# Patient Record
Sex: Female | Born: 1965 | Race: Black or African American | Hispanic: No | Marital: Single | Smoking: Former smoker
Health system: Southern US, Community
[De-identification: ages and names within clinical notes are randomized; demographics above are authoritative.]

## PROBLEM LIST (undated history)

## (undated) DIAGNOSIS — N920 Excessive and frequent menstruation with regular cycle: Secondary | ICD-10-CM

## (undated) DIAGNOSIS — Z9289 Personal history of other medical treatment: Secondary | ICD-10-CM

## (undated) DIAGNOSIS — D509 Iron deficiency anemia, unspecified: Secondary | ICD-10-CM

## (undated) DIAGNOSIS — D259 Leiomyoma of uterus, unspecified: Secondary | ICD-10-CM

## (undated) HISTORY — DX: Personal history of other medical treatment: Z92.89

## (undated) HISTORY — PX: TUBAL LIGATION: SHX77

---

## 2001-08-15 ENCOUNTER — Encounter: Payer: Self-pay | Admitting: Internal Medicine

## 2001-08-15 ENCOUNTER — Ambulatory Visit (HOSPITAL_COMMUNITY): Admission: RE | Admit: 2001-08-15 | Discharge: 2001-08-15 | Payer: Self-pay | Admitting: Internal Medicine

## 2001-09-29 ENCOUNTER — Encounter: Payer: Self-pay | Admitting: Internal Medicine

## 2001-09-29 ENCOUNTER — Emergency Department (HOSPITAL_COMMUNITY): Admission: EM | Admit: 2001-09-29 | Discharge: 2001-09-29 | Payer: Self-pay | Admitting: Internal Medicine

## 2002-02-02 ENCOUNTER — Emergency Department (HOSPITAL_COMMUNITY): Admission: EM | Admit: 2002-02-02 | Discharge: 2002-02-02 | Payer: Self-pay | Admitting: Emergency Medicine

## 2002-03-11 ENCOUNTER — Emergency Department (HOSPITAL_COMMUNITY): Admission: EM | Admit: 2002-03-11 | Discharge: 2002-03-11 | Payer: Self-pay | Admitting: Emergency Medicine

## 2002-12-14 ENCOUNTER — Emergency Department (HOSPITAL_COMMUNITY): Admission: EM | Admit: 2002-12-14 | Discharge: 2002-12-14 | Payer: Self-pay | Admitting: Emergency Medicine

## 2005-02-14 ENCOUNTER — Ambulatory Visit (HOSPITAL_COMMUNITY): Admission: RE | Admit: 2005-02-14 | Discharge: 2005-02-14 | Payer: Self-pay | Admitting: Obstetrics and Gynecology

## 2005-05-17 ENCOUNTER — Encounter: Admission: RE | Admit: 2005-05-17 | Discharge: 2005-05-17 | Payer: Self-pay | Admitting: Family Medicine

## 2005-08-28 ENCOUNTER — Emergency Department (HOSPITAL_COMMUNITY): Admission: EM | Admit: 2005-08-28 | Discharge: 2005-08-28 | Payer: Self-pay | Admitting: Emergency Medicine

## 2006-02-01 ENCOUNTER — Emergency Department (HOSPITAL_COMMUNITY): Admission: EM | Admit: 2006-02-01 | Discharge: 2006-02-01 | Payer: Self-pay | Admitting: Emergency Medicine

## 2008-04-15 ENCOUNTER — Ambulatory Visit (HOSPITAL_COMMUNITY): Admission: RE | Admit: 2008-04-15 | Discharge: 2008-04-15 | Payer: Self-pay | Admitting: Family Medicine

## 2009-01-31 ENCOUNTER — Emergency Department (HOSPITAL_COMMUNITY): Admission: EM | Admit: 2009-01-31 | Discharge: 2009-01-31 | Payer: Self-pay | Admitting: Emergency Medicine

## 2009-05-24 ENCOUNTER — Emergency Department (HOSPITAL_COMMUNITY): Admission: EM | Admit: 2009-05-24 | Discharge: 2009-05-24 | Payer: Self-pay | Admitting: Emergency Medicine

## 2010-02-27 ENCOUNTER — Encounter: Payer: Self-pay | Admitting: Family Medicine

## 2010-02-27 ENCOUNTER — Encounter: Payer: Self-pay | Admitting: Obstetrics & Gynecology

## 2010-04-26 LAB — URINE MICROSCOPIC-ADD ON

## 2010-04-26 LAB — URINALYSIS, ROUTINE W REFLEX MICROSCOPIC
Bilirubin Urine: NEGATIVE
Hgb urine dipstick: NEGATIVE
Nitrite: NEGATIVE
Protein, ur: NEGATIVE mg/dL
pH: 6 (ref 5.0–8.0)

## 2010-07-25 ENCOUNTER — Emergency Department (HOSPITAL_COMMUNITY)
Admission: EM | Admit: 2010-07-25 | Discharge: 2010-07-25 | Disposition: A | Discharge status: Home / Self Care | Payer: Self-pay | Attending: Emergency Medicine | Admitting: Emergency Medicine

## 2010-07-25 ENCOUNTER — Emergency Department (HOSPITAL_COMMUNITY): Payer: Self-pay

## 2010-07-25 DIAGNOSIS — W19XXXA Unspecified fall, initial encounter: Secondary | ICD-10-CM | POA: Insufficient documentation

## 2010-07-25 DIAGNOSIS — Z79899 Other long term (current) drug therapy: Secondary | ICD-10-CM | POA: Insufficient documentation

## 2010-07-25 DIAGNOSIS — IMO0002 Reserved for concepts with insufficient information to code with codable children: Secondary | ICD-10-CM | POA: Insufficient documentation

## 2013-04-14 ENCOUNTER — Emergency Department (HOSPITAL_COMMUNITY)
Admission: EM | Admit: 2013-04-14 | Discharge: 2013-04-15 | Disposition: A | Discharge status: Home / Self Care | Payer: Managed Care, Other (non HMO) | Attending: Emergency Medicine | Admitting: Emergency Medicine

## 2013-04-14 ENCOUNTER — Encounter (HOSPITAL_COMMUNITY): Payer: Self-pay | Admitting: Emergency Medicine

## 2013-04-14 DIAGNOSIS — Z87891 Personal history of nicotine dependence: Secondary | ICD-10-CM | POA: Insufficient documentation

## 2013-04-14 DIAGNOSIS — R112 Nausea with vomiting, unspecified: Secondary | ICD-10-CM | POA: Insufficient documentation

## 2013-04-14 DIAGNOSIS — R000 Tachycardia, unspecified: Secondary | ICD-10-CM | POA: Insufficient documentation

## 2013-04-14 DIAGNOSIS — R197 Diarrhea, unspecified: Secondary | ICD-10-CM | POA: Insufficient documentation

## 2013-04-14 MED ORDER — ONDANSETRON HCL 4 MG PO TABS: 4.0000 mg | ORAL_TABLET | Freq: Four times a day (QID) | ORAL | Status: DC

## 2013-04-14 MED ORDER — ONDANSETRON HCL 4 MG/2ML IJ SOLN
4.0000 mg | Freq: Once | INTRAMUSCULAR | Status: AC
  Administered 2013-04-14: 4 mg via INTRAVENOUS
  Filled 2013-04-14: qty 2

## 2013-04-14 MED ORDER — SODIUM CHLORIDE 0.9 % IV BOLUS (SEPSIS)
1000.0000 mL | Freq: Once | INTRAVENOUS | Status: AC
  Administered 2013-04-14: 1000 mL via INTRAVENOUS

## 2013-04-14 MED ORDER — KETOROLAC TROMETHAMINE 30 MG/ML IJ SOLN
30.0000 mg | Freq: Once | INTRAMUSCULAR | Status: AC
  Administered 2013-04-14: 30 mg via INTRAVENOUS
  Filled 2013-04-14: qty 1

## 2013-04-14 NOTE — ED Notes (Signed)
Pt reports flu like symptoms that started around 1000 this AM. Pt reports vomiting, diarrhea & having a fever. Pt took tylenol around 1500.

## 2013-04-14 NOTE — ED Provider Notes (Signed)
CSN: [id number]     Arrival date & time 04/14/13  2214 History   This chart was scribed for Shelley Lower, MD by Era Bumpers, ED scribe. This patient was seen in room APA03/[facility]03 and the patient's care was started at 2214.  Chief Complaint  Patient presents with  . Influenza   The history is provided by the patient. No language interpreter was used.   HPI Comments: Shelley Doyle is a 48 y.o. female who presents to the Emergency Department for nausea, emesis and diarrhea that began this AM about 11 hours ago. She tried some OTC ant emetic medicine w/no relief. She also tried Tylenol for fever of 100.8 F w/no relief. 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 She reports associated chills. She denies any blood in vomitus or stool. Denies recent out of country travel. She denies medical hx. She denies abdominal or back pain.   History reviewed. No pertinent past medical history. History reviewed. No pertinent past surgical history. No family history on file. History  Substance Use Topics  . Smoking status: Former Research scientist (life sciences)  . Smokeless tobacco: Not on file  . Alcohol Use: No   OB History   Grav Para Term Preterm Abortions TAB SAB Ect Mult Living                 Review of Systems  Constitutional: Positive for fever and chills.  Respiratory: Negative for cough and shortness of breath.   Cardiovascular: Negative for chest pain.  Gastrointestinal: Positive for nausea, vomiting and diarrhea. Negative for abdominal pain.  Musculoskeletal: Negative for back pain.  All other systems reviewed and are negative.    Allergies  Review of patient's allergies indicates no known allergies.  Home Medications   Current Outpatient Rx  Name  Route  Sig  Dispense  Refill  . anti-nausea (EMETROL) solution   Oral   Take 10 mLs by mouth 2 (two) times daily as needed for nausea or vomiting (medication was only taken twice).          Triage Vitals: BP 117/77  Pulse 120   Temp(Src) 99.3 F (37.4 C) (Oral)  Resp 18  Ht 5' 9.75" (1.772 m)  Wt 174 lb 2 oz (78.983 kg)  BMI 25.15 kg/m2  SpO2 97%  LMP 04/04/2013  Physical Exam  Nursing note and vitals reviewed. Constitutional: She is oriented to person, place, and time. She appears well-developed and well-nourished. No distress.  HENT:  Head: Normocephalic and atraumatic.  Mildly dry mucous membranes  Eyes: Conjunctivae are normal. Right eye exhibits no discharge. Left eye exhibits no discharge.  Neck: Normal range of motion.  Cardiovascular: Regular rhythm and normal heart sounds.   tachycardic  Pulmonary/Chest: Effort normal and breath sounds normal. No respiratory distress. She has no wheezes. She has no rales.  Abdominal: Soft. She exhibits no distension. There is no tenderness.  Musculoskeletal: Normal range of motion. She exhibits no edema.  Neurological: She is alert and oriented to person, place, and time.  Skin: Skin is warm and dry. No rash noted.  Psychiatric: She has a normal mood and affect. Thought content normal.    ED Course  Procedures (including critical care time) DIAGNOSTIC STUDIES: Oxygen Saturation is 97% on room air, normal by my interpretation.    COORDINATION OF CARE: At 1110 PM Discussed treatment plan with patient which includes IV fluids, nausea medicine and toradol. Patient agrees.   Labs Review Labs Reviewed - No data to  display Imaging Review No results found.  IV fluids provided IV Zofran. IV Toradol.  On recheck patient is feeling much better, she is tolerating by mouth fluids without any further nausea or vomiting. Her repeat abdominal exam is soft, nontender and nondistended. She is requesting to be discharged home. Work note provided. Prescription for Zofran provided as needed. Patient will take Imodium for any persistent diarrhea. She agrees to return precautions and outpatient followup.  MDM   Final diagnoses:  Nausea vomiting and diarrhea   Presents  with less than 24 hours of symptoms. Some intermittent abdominal cramping but no significant pain. Her abdominal exam is benign. Improved with IV fluids and medications provided. Vital signs and nursing notes reviewed and considered. No indication for imaging or labs at this time. Her heart rate has normalized.  No fevers, likely viral process   I personally performed the services described in this documentation, which was scribed in my presence. The recorded information has been reviewed and is accurate.      Shelley Lower, MD 04/15/13 [phone]

## 2013-04-14 NOTE — ED Notes (Signed)
Pt provided ice chips at this time.  

## 2013-04-14 NOTE — ED Notes (Signed)
Pt reports generalized body aches from the waist up. Pt states vomiting & diarrhea along w/ a fever this afternoon.

## 2013-04-14 NOTE — Discharge Instructions (Signed)
Viral Gastroenteritis Viral gastroenteritis is also known as stomach flu. This condition affects the stomach and intestinal tract. It can cause sudden diarrhea and vomiting. The illness typically lasts 3 to 8 days. Most people develop an immune response that eventually gets rid of the virus. While this natural response develops, the virus can make you quite ill. CAUSES  Many different viruses can cause gastroenteritis, such as rotavirus or noroviruses. You can catch one of these viruses by consuming contaminated food or water. You may also catch a virus by sharing utensils or other personal items with an infected person or by touching a contaminated surface. SYMPTOMS  The most common symptoms are diarrhea and vomiting. These problems can cause a severe loss of body fluids (dehydration) and a body salt (electrolyte) imbalance. Other symptoms may include:  Fever.  Headache.  Fatigue.  Abdominal pain. DIAGNOSIS  Your caregiver can usually diagnose viral gastroenteritis based on your symptoms and a physical exam. A stool sample may also be taken to test for the presence of viruses or other infections. TREATMENT  This illness typically goes away on its own. Treatments are aimed at rehydration. The most serious cases of viral gastroenteritis involve vomiting so severely that you are not able to keep fluids down. In these cases, fluids must be given through an intravenous line (IV). HOME CARE INSTRUCTIONS   Drink enough fluids to keep your urine clear or pale yellow. Drink small amounts of fluids frequently and increase the amounts as tolerated.  Ask your caregiver for specific rehydration instructions.  Avoid:  Foods high in sugar.  Alcohol.  Carbonated drinks.  Tobacco.  Juice.  Caffeine drinks.  Extremely hot or cold fluids.  Fatty, greasy foods.  Too much intake of anything at one time.  Dairy products until 24 to 48 hours after diarrhea stops.  You may consume probiotics.  Probiotics are active cultures of beneficial bacteria. They may lessen the amount and number of diarrheal stools in adults. Probiotics can be found in yogurt with active cultures and in supplements.  Wash your hands well to avoid spreading the virus.  Only take over-the-counter or prescription medicines for pain, discomfort, or fever as directed by your caregiver. Do not give aspirin to children. Antidiarrheal medicines are not recommended.  Ask your caregiver if you should continue to take your regular prescribed and over-the-counter medicines.  Keep all follow-up appointments as directed by your caregiver. SEEK IMMEDIATE MEDICAL CARE IF:   You are unable to keep fluids down.  You do not urinate at least once every 6 to 8 hours.  You develop shortness of breath.  You notice blood in your stool or vomit. This may look like coffee grounds.  You have abdominal pain that increases or is concentrated in one small area (localized).  You have persistent vomiting or diarrhea.  You have a fever.  The patient is a child younger than 3 months, and he or she has a fever.  The patient is a child older than 3 months, and he or she has a fever and persistent symptoms.  The patient is a child older than 3 months, and he or she has a fever and symptoms suddenly get worse.  The patient is a baby, and he or she has no tears when crying. MAKE SURE YOU:   Understand these instructions.  Will watch your condition.  Will get help right away if you are not doing well or get worse. Document Released: 01/23/2005 Document Revised: 04/17/2011 Document Reviewed: 11/09/2010   [employer] Patient Information 2014 [employer]  

## 2013-04-15 NOTE — ED Notes (Signed)
Pt alert & oriented x4, stable gait. Patient given discharge instructions, paperwork & prescription(s). Patient  instructed to stop at the registration desk to finish any additional paperwork. Patient verbalized understanding. Pt left department w/ no further questions. 

## 2013-06-13 ENCOUNTER — Emergency Department (HOSPITAL_COMMUNITY)
Admission: EM | Admit: 2013-06-13 | Discharge: 2013-06-13 | Disposition: A | Discharge status: Home / Self Care | Payer: Managed Care, Other (non HMO) | Attending: Emergency Medicine | Admitting: Emergency Medicine

## 2013-06-13 ENCOUNTER — Emergency Department (HOSPITAL_COMMUNITY): Payer: Managed Care, Other (non HMO)

## 2013-06-13 ENCOUNTER — Encounter (HOSPITAL_COMMUNITY): Payer: Self-pay | Admitting: Emergency Medicine

## 2013-06-13 DIAGNOSIS — R109 Unspecified abdominal pain: Secondary | ICD-10-CM

## 2013-06-13 DIAGNOSIS — R1084 Generalized abdominal pain: Secondary | ICD-10-CM | POA: Insufficient documentation

## 2013-06-13 DIAGNOSIS — Z87891 Personal history of nicotine dependence: Secondary | ICD-10-CM | POA: Insufficient documentation

## 2013-06-13 DIAGNOSIS — Z9851 Tubal ligation status: Secondary | ICD-10-CM | POA: Insufficient documentation

## 2013-06-13 DIAGNOSIS — R112 Nausea with vomiting, unspecified: Secondary | ICD-10-CM | POA: Insufficient documentation

## 2013-06-13 DIAGNOSIS — R52 Pain, unspecified: Secondary | ICD-10-CM | POA: Insufficient documentation

## 2013-06-13 DIAGNOSIS — Z79899 Other long term (current) drug therapy: Secondary | ICD-10-CM | POA: Insufficient documentation

## 2013-06-13 DIAGNOSIS — D259 Leiomyoma of uterus, unspecified: Secondary | ICD-10-CM | POA: Insufficient documentation

## 2013-06-13 DIAGNOSIS — Z3202 Encounter for pregnancy test, result negative: Secondary | ICD-10-CM | POA: Insufficient documentation

## 2013-06-13 DIAGNOSIS — D219 Benign neoplasm of connective and other soft tissue, unspecified: Secondary | ICD-10-CM

## 2013-06-13 DIAGNOSIS — N390 Urinary tract infection, site not specified: Secondary | ICD-10-CM | POA: Insufficient documentation

## 2013-06-13 LAB — URINE MICROSCOPIC-ADD ON

## 2013-06-13 LAB — CBC WITH DIFFERENTIAL/PLATELET
BASOS ABS: 0 10*3/uL (ref 0.0–0.1)
Basophils Relative: 0 % (ref 0–1)
EOS PCT: 0 % (ref 0–5)
Eosinophils Absolute: 0 10*3/uL (ref 0.0–0.7)
HEMATOCRIT: 37.6 % (ref 36.0–46.0)
HEMOGLOBIN: 12.6 g/dL (ref 12.0–15.0)
LYMPHS PCT: 5 % — AB (ref 12–46)
Lymphs Abs: 0.4 10*3/uL — ABNORMAL LOW (ref 0.7–4.0)
MCH: 28.8 pg (ref 26.0–34.0)
MCHC: 33.5 g/dL (ref 30.0–36.0)
MCV: 85.8 fL (ref 78.0–100.0)
MONO ABS: 0.7 10*3/uL (ref 0.1–1.0)
MONOS PCT: 9 % (ref 3–12)
Neutro Abs: 6.4 10*3/uL (ref 1.7–7.7)
Neutrophils Relative %: 86 % — ABNORMAL HIGH (ref 43–77)
Platelets: 201 10*3/uL (ref 150–400)
RBC: 4.38 MIL/uL (ref 3.87–5.11)
RDW: 15.8 % — AB (ref 11.5–15.5)
WBC: 7.5 10*3/uL (ref 4.0–10.5)

## 2013-06-13 LAB — COMPREHENSIVE METABOLIC PANEL
ALT: 31 U/L (ref 0–35)
AST: 21 U/L (ref 0–37)
Albumin: 3.9 g/dL (ref 3.5–5.2)
Alkaline Phosphatase: 65 U/L (ref 39–117)
BILIRUBIN TOTAL: 0.5 mg/dL (ref 0.3–1.2)
BUN: 10 mg/dL (ref 6–23)
CALCIUM: 9.1 mg/dL (ref 8.4–10.5)
CO2: 24 meq/L (ref 19–32)
CREATININE: 1.13 mg/dL — AB (ref 0.50–1.10)
Chloride: 98 mEq/L (ref 96–112)
GFR, EST AFRICAN AMERICAN: 66 mL/min — AB (ref >90)
GFR, EST NON AFRICAN AMERICAN: 57 mL/min — AB (ref >90)
GLUCOSE: 95 mg/dL (ref 70–99)
Potassium: 3.7 mEq/L (ref 3.7–5.3)
Sodium: 135 mEq/L — ABNORMAL LOW (ref 137–147)
Total Protein: 7 g/dL (ref 6.0–8.3)

## 2013-06-13 LAB — URINALYSIS, ROUTINE W REFLEX MICROSCOPIC
Bilirubin Urine: NEGATIVE
Glucose, UA: NEGATIVE mg/dL
KETONES UR: NEGATIVE mg/dL
LEUKOCYTES UA: NEGATIVE
NITRITE: NEGATIVE
PROTEIN: NEGATIVE mg/dL
Specific Gravity, Urine: 1.01 (ref 1.005–1.030)
UROBILINOGEN UA: 0.2 mg/dL (ref 0.0–1.0)
pH: 6 (ref 5.0–8.0)

## 2013-06-13 LAB — LIPASE, BLOOD: LIPASE: 18 U/L (ref 11–59)

## 2013-06-13 LAB — PREGNANCY, URINE: PREG TEST UR: NEGATIVE

## 2013-06-13 MED ORDER — SODIUM CHLORIDE 0.9 % IV SOLN: INTRAVENOUS | Status: DC

## 2013-06-13 MED ORDER — ONDANSETRON 4 MG PO TBDP: ORAL_TABLET | ORAL | Status: DC

## 2013-06-13 MED ORDER — IOHEXOL 300 MG/ML  SOLN
50.0000 mL | Freq: Once | INTRAMUSCULAR | Status: AC | PRN
  Administered 2013-06-13: 50 mL via ORAL

## 2013-06-13 MED ORDER — SODIUM CHLORIDE 0.9 % IV BOLUS (SEPSIS)
500.0000 mL | Freq: Once | INTRAVENOUS | Status: AC
  Administered 2013-06-13: 500 mL via INTRAVENOUS

## 2013-06-13 MED ORDER — ONDANSETRON HCL 4 MG/2ML IJ SOLN
4.0000 mg | INTRAMUSCULAR | Status: DC | PRN
  Administered 2013-06-13: 4 mg via INTRAVENOUS
  Filled 2013-06-13: qty 2

## 2013-06-13 MED ORDER — IOHEXOL 300 MG/ML  SOLN
100.0000 mL | Freq: Once | INTRAMUSCULAR | Status: AC | PRN
  Administered 2013-06-13: 100 mL via INTRAVENOUS

## 2013-06-13 MED ORDER — MORPHINE SULFATE 4 MG/ML IJ SOLN
4.0000 mg | INTRAMUSCULAR | Status: DC | PRN
  Administered 2013-06-13: 4 mg via INTRAVENOUS
  Filled 2013-06-13: qty 1

## 2013-06-13 MED ORDER — FAMOTIDINE IN NACL 20-0.9 MG/50ML-% IV SOLN
20.0000 mg | Freq: Once | INTRAVENOUS | Status: AC
  Administered 2013-06-13: 20 mg via INTRAVENOUS
  Filled 2013-06-13: qty 50

## 2013-06-13 MED ORDER — DEXTROSE 5 % IV SOLN
1.0000 g | Freq: Once | INTRAVENOUS | Status: AC
  Administered 2013-06-13: 1 g via INTRAVENOUS
  Filled 2013-06-13: qty 10

## 2013-06-13 MED ORDER — CEPHALEXIN 500 MG PO CAPS: 500.0000 mg | ORAL_CAPSULE | Freq: Four times a day (QID) | ORAL | Status: DC

## 2013-06-13 MED ORDER — HYDROCODONE-ACETAMINOPHEN 5-325 MG PO TABS: 1.0000 | ORAL_TABLET | Freq: Four times a day (QID) | ORAL | Status: DC | PRN

## 2013-06-13 MED ORDER — ACETAMINOPHEN 500 MG PO TABS
1000.0000 mg | ORAL_TABLET | Freq: Once | ORAL | Status: AC
  Administered 2013-06-13: 1000 mg via ORAL
  Filled 2013-06-13 (×2): qty 2

## 2013-06-13 NOTE — Discharge Instructions (Signed)
Follow up with your md next week. °

## 2013-06-13 NOTE — ED Notes (Signed)
Patient provided urine specimen from clean catch. Dr. Thurnell Garbe notified and does not want In and out cath.

## 2013-06-13 NOTE — ED Notes (Signed)
abd pain, n/v no diarrhea.  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

## 2013-06-13 NOTE — ED Provider Notes (Signed)
CSN: [id number]     Arrival date & time 06/13/13  1346 History   First MD Initiated Contact with Patient 06/13/13 1407     Chief Complaint  Patient presents with  . Abdominal Pain      HPI Pt was seen at 1410.  Per pt, c/o gradual onset and persistence of constant generalized abd "pain" since last evening.  Has been associated with multiple intermittent episodes of N/V as well as generalized body aches/fatigue. Denies diarrhea, no fevers, no back pain, no rash, no CP/SOB, no sore throat, no cough, no black or blood in stools or emesis.      History reviewed. No pertinent past medical history.  Past Surgical History  Procedure Laterality Date  . Tubal ligation      History  Substance Use Topics  . Smoking status: Former Research scientist (life sciences)  . Smokeless tobacco: Not on file  . Alcohol Use: No    Review of Systems ROS: Statement: All systems negative except as marked or noted in the HPI; Constitutional: Negative for fever and chills. +generalized body aches/fatigue ; ; Eyes: Negative for eye pain, redness and discharge. ; ; ENMT: Negative for ear pain, hoarseness, nasal congestion, sinus pressure and sore throat. ; ; Cardiovascular: Negative for chest pain, palpitations, diaphoresis, dyspnea and peripheral edema. ; ; Respiratory: Negative for cough, wheezing and stridor. ; ; Gastrointestinal: +N/V, abd pain. Negative for diarrhea, blood in stool, hematemesis, jaundice and rectal bleeding. . ; ; Genitourinary: Negative for dysuria, flank pain and hematuria. ; ; Musculoskeletal: Negative for back pain and neck pain. Negative for swelling and trauma.; ; Skin: Negative for pruritus, rash, abrasions, blisters, bruising and skin lesion.; ; Neuro: Negative for headache, lightheadedness and neck stiffness. Negative for weakness, altered level of consciousness , altered mental status, extremity weakness, paresthesias, involuntary movement, seizure and syncope.        Allergies  Review of patient's allergies  indicates no known allergies.  Home Medications   Prior to Admission medications   Medication Sig Start Date End Date Taking? Authorizing Provider  anti-nausea (EMETROL) solution Take 10 mLs by mouth 2 (two) times daily as needed for nausea or vomiting (medication was only taken twice).   Yes Historical Provider, MD  MELATONIN PO Take 1 tablet by mouth daily as needed (sleep).   Yes Historical Provider, MD  Ranitidine HCl (ACID CONTROL PO) Take 1 tablet by mouth daily as needed (acid reflux).   Yes Historical Provider, MD   BP 105/78  Pulse 124  Temp(Src) 100 F (37.8 C) (Oral)  Ht 5' 9.75" (1.772 m)  Wt 170 lb (77.111 kg)  BMI 24.56 kg/m2  SpO2 97% Physical Exam 1415: Physical examination:  Nursing notes reviewed; Vital signs and O2 SAT reviewed;  Constitutional: Well developed, Well nourished, Well hydrated, In no acute distress; Head:  Normocephalic, atraumatic; Eyes: EOMI, PERRL, No scleral icterus; ENMT: TM's clear bilat. +edemetous nasal turbinates bilat with clear rhinorrhea. Mouth and pharynx normal, Mucous membranes moist; Neck: Supple, Full range of motion, No lymphadenopathy; Cardiovascular: Tachycardic rate and rhythm, No gallop; Respiratory: Breath sounds clear & equal bilaterally, No rales, rhonchi, wheezes.  Speaking full sentences with ease, Normal respiratory effort/excursion; Chest: Nontender, Movement normal; Abdomen: Soft, +mild diffuse tenderness to palp. No rebound or guarding. Nondistended, Normal bowel sounds; Genitourinary: No CVA tenderness; Extremities: Pulses normal, No tenderness, No edema, No calf edema or asymmetry.; Neuro: AA&Ox3, Major CN grossly intact.  Speech clear. No gross focal motor or sensory deficits in extremities.; Skin:  Color normal, Warm, Dry.   ED Course  Procedures     EKG Interpretation None      MDM  MDM Reviewed: previous chart, nursing note and vitals Reviewed previous: labs Interpretation: labs and x-ray   Results for orders  placed during the hospital encounter of 06/13/13  CBC WITH DIFFERENTIAL      Result Value Ref Range   WBC 7.5  4.0 - 10.5 K/uL   RBC 4.38  3.87 - 5.11 MIL/uL   Hemoglobin 12.6  12.0 - 15.0 g/dL   HCT 37.6  36.0 - 46.0 %   MCV 85.8  78.0 - 100.0 fL   MCH 28.8  26.0 - 34.0 pg   MCHC 33.5  30.0 - 36.0 g/dL   RDW 15.8 (*) 11.5 - 15.5 %   Platelets 201  150 - 400 K/uL   Neutrophils Relative % 86 (*) 43 - 77 %   Neutro Abs 6.4  1.7 - 7.7 K/uL   Lymphocytes Relative 5 (*) 12 - 46 %   Lymphs Abs 0.4 (*) 0.7 - 4.0 K/uL   Monocytes Relative 9  3 - 12 %   Monocytes Absolute 0.7  0.1 - 1.0 K/uL   Eosinophils Relative 0  0 - 5 %   Eosinophils Absolute 0.0  0.0 - 0.7 K/uL   Basophils Relative 0  0 - 1 %   Basophils Absolute 0.0  0.0 - 0.1 K/uL  COMPREHENSIVE METABOLIC PANEL      Result Value Ref Range   Sodium 135 (*) 137 - 147 mEq/L   Potassium 3.7  3.7 - 5.3 mEq/L   Chloride 98  96 - 112 mEq/L   CO2 24  19 - 32 mEq/L   Glucose, Bld 95  70 - 99 mg/dL   BUN 10  6 - 23 mg/dL   Creatinine, Ser 1.13 (*) 0.50 - 1.10 mg/dL   Calcium 9.1  8.4 - 10.5 mg/dL   Total Protein 7.0  6.0 - 8.3 g/dL   Albumin 3.9  3.5 - 5.2 g/dL   AST 21  0 - 37 U/L   ALT 31  0 - 35 U/L   Alkaline Phosphatase 65  39 - 117 U/L   Total Bilirubin 0.5  0.3 - 1.2 mg/dL   GFR calc non Af Amer 57 (*) >90 mL/min   GFR calc Af Amer 66 (*) >90 mL/min  LIPASE, BLOOD      Result Value Ref Range   Lipase 18  11 - 59 U/L   Dg Chest 2 View 06/13/2013   CLINICAL DATA:  Abdominal pain.  Nausea and vomiting.  EXAM: CHEST  2 VIEW  COMPARISON:  05/24/2009  FINDINGS: The patient has taken a poor inspiration. Heart size is normal. Mediastinal shadows are normal. The lungs are clear. No effusions. No bony abnormalities.  IMPRESSION: Normal chest   Electronically Signed   By: Nelson Chimes M.D.   On: 06/13/2013 15:40    1620: Udip/pregnancy and CT A/P pending. Dispo based on results. Sign out to Dr. Roderic Palau.     Alfonzo Feller,  DO 06/13/13 [phone]

## 2014-12-09 ENCOUNTER — Encounter (HOSPITAL_BASED_OUTPATIENT_CLINIC_OR_DEPARTMENT_OTHER): Payer: Self-pay

## 2014-12-09 ENCOUNTER — Emergency Department (HOSPITAL_BASED_OUTPATIENT_CLINIC_OR_DEPARTMENT_OTHER)
Admission: EM | Admit: 2014-12-09 | Discharge: 2014-12-09 | Disposition: A | Discharge status: Home / Self Care | Payer: Self-pay | Attending: Emergency Medicine | Admitting: Emergency Medicine

## 2014-12-09 DIAGNOSIS — R5383 Other fatigue: Secondary | ICD-10-CM | POA: Insufficient documentation

## 2014-12-09 DIAGNOSIS — Z792 Long term (current) use of antibiotics: Secondary | ICD-10-CM | POA: Insufficient documentation

## 2014-12-09 DIAGNOSIS — N924 Excessive bleeding in the premenopausal period: Secondary | ICD-10-CM | POA: Insufficient documentation

## 2014-12-09 DIAGNOSIS — Z87891 Personal history of nicotine dependence: Secondary | ICD-10-CM | POA: Insufficient documentation

## 2014-12-09 LAB — COMPREHENSIVE METABOLIC PANEL
ALK PHOS: 58 U/L (ref 38–126)
ALT: 16 U/L (ref 14–54)
AST: 18 U/L (ref 15–41)
Albumin: 4.2 g/dL (ref 3.5–5.0)
Anion gap: 7 (ref 5–15)
BILIRUBIN TOTAL: 0.4 mg/dL (ref 0.3–1.2)
BUN: 14 mg/dL (ref 6–20)
CALCIUM: 8.8 mg/dL — AB (ref 8.9–10.3)
CO2: 23 mmol/L (ref 22–32)
CREATININE: 1.08 mg/dL — AB (ref 0.44–1.00)
Chloride: 109 mmol/L (ref 101–111)
GFR, EST NON AFRICAN AMERICAN: 59 mL/min — AB (ref >60)
Glucose, Bld: 129 mg/dL — ABNORMAL HIGH (ref 65–99)
Potassium: 3.4 mmol/L — ABNORMAL LOW (ref 3.5–5.1)
Sodium: 139 mmol/L (ref 135–145)
Total Protein: 7.2 g/dL (ref 6.5–8.1)

## 2014-12-09 LAB — CBC WITH DIFFERENTIAL/PLATELET
Basophils Absolute: 0 10*3/uL (ref 0.0–0.1)
Basophils Relative: 0 %
EOS PCT: 5 %
Eosinophils Absolute: 0.2 10*3/uL (ref 0.0–0.7)
HEMATOCRIT: 31.9 % — AB (ref 36.0–46.0)
HEMOGLOBIN: 10.2 g/dL — AB (ref 12.0–15.0)
LYMPHS ABS: 1.6 10*3/uL (ref 0.7–4.0)
LYMPHS PCT: 32 %
MCH: 28 pg (ref 26.0–34.0)
MCHC: 32 g/dL (ref 30.0–36.0)
MCV: 87.6 fL (ref 78.0–100.0)
Monocytes Absolute: 0.4 10*3/uL (ref 0.1–1.0)
Monocytes Relative: 9 %
NEUTROS ABS: 2.7 10*3/uL (ref 1.7–7.7)
NEUTROS PCT: 54 %
Platelets: 276 10*3/uL (ref 150–400)
RBC: 3.64 MIL/uL — AB (ref 3.87–5.11)
RDW: 14.9 % (ref 11.5–15.5)
WBC: 4.9 10*3/uL (ref 4.0–10.5)

## 2014-12-09 MED ORDER — FERROUS SULFATE 325 (65 FE) MG PO TABS: 325.0000 mg | ORAL_TABLET | Freq: Every day | ORAL | Status: DC

## 2014-12-09 MED ORDER — KETOROLAC TROMETHAMINE 15 MG/ML IJ SOLN
15.0000 mg | Freq: Once | INTRAMUSCULAR | Status: AC
  Administered 2014-12-09: 15 mg via INTRAVENOUS
  Filled 2014-12-09: qty 1

## 2014-12-09 MED ORDER — MEDROXYPROGESTERONE ACETATE 10 MG PO TABS: 10.0000 mg | ORAL_TABLET | Freq: Every day | ORAL | Status: DC

## 2014-12-09 NOTE — ED Notes (Signed)
Pt c/o vaginal hemorraging x52months , states yesterday she was going through 10 tampons in 2.5hrs; states her PCP told her she has fibroids and going through menopause; states now having lower pain with nausea and weakness

## 2014-12-09 NOTE — ED Notes (Addendum)
Shelley Doyle reports having very heavy vaginal bleeding were she saturates as many as ten tampons  Daily for the last several months. She admits to intermittent abd cramping, denies sexual activity.  Pelvic cart set up and at bedside verbal consent obtained for pelvic exam obtained by Dr Maryan Rued. Time out completed.

## 2014-12-09 NOTE — ED Provider Notes (Addendum)
CSN: [id number]     Arrival date & time 12/09/14  0201 History   First MD Initiated Contact with Patient 12/09/14 0241     Chief Complaint  Patient presents with  . Vaginal Bleeding     (Consider location/radiation/quality/duration/timing/severity/associated sxs/prior Treatment) Patient is a 49 y.o. female presenting with vaginal bleeding. The history is provided by the patient.  Vaginal Bleeding Quality:  Bright red, clots, dark red and heavier than menses Severity:  Severe Duration:  6 months Timing:  Constant Progression:  Unchanged Chronicity:  Chronic Menstrual history: Patient for the last 6 months has been bleeding close to 28 days out of the month. Number of pads used:  On irregular basis she will go through 10 tampons in 2-1/2 hours and still bleed through onto her clothing Possible pregnancy: no   Context: spontaneously   Context comment:  Not currently sexually active Relieved by:  Nothing Worsened by:  Nothing tried Ineffective treatments:  None tried Associated symptoms: abdominal pain and fatigue   Associated symptoms: no back pain, no dysuria, no fever, no nausea and no vaginal discharge   Associated symptoms comment:  Patient states today she felt generally weak, slightly short of breath and extremely tired which prompted her to come in. The bleeding has been heavy for quite some time and she has known fibroids. Risk factors: no bleeding disorder, no gynecological surgery, no new sexual partner and does not have unprotected sex     History reviewed. No pertinent past medical history. Past Surgical History  Procedure Laterality Date  . Tubal ligation     No family history on file. Social History  Substance Use Topics  . Smoking status: Former Research scientist (life sciences)  . Smokeless tobacco: None  . Alcohol Use: No   OB History    No data available     Review of Systems  Constitutional: Positive for fatigue. Negative for fever.  Respiratory: Negative for chest tightness.    Cardiovascular: Negative for chest pain, palpitations and leg swelling.  Gastrointestinal: Positive for abdominal pain. Negative for nausea.  Genitourinary: Positive for vaginal bleeding. Negative for dysuria and vaginal discharge.  Musculoskeletal: Negative for back pain.  All other systems reviewed and are negative.     Allergies  Review of patient's allergies indicates no known allergies.  Home Medications   Prior to Admission medications   Medication Sig Start Date End Date Taking? Authorizing Provider  anti-nausea (EMETROL) solution Take 10 mLs by mouth 2 (two) times daily as needed for nausea or vomiting (medication was only taken twice).    Historical Provider, MD  cephALEXin (KEFLEX) 500 MG capsule Take 1 capsule (500 mg total) by mouth 4 (four) times daily. 06/13/13   Milton Ferguson, MD  HYDROcodone-acetaminophen (NORCO/VICODIN) 5-325 MG per tablet Take 1 tablet by mouth every 6 (six) hours as needed for moderate pain. 06/13/13   Milton Ferguson, MD  MELATONIN PO Take 1 tablet by mouth daily as needed (sleep).    Historical Provider, MD  ondansetron (ZOFRAN ODT) 4 MG disintegrating tablet 4mg  ODT q4 hours prn nausea/vomit 06/13/13   Milton Ferguson, MD  Ranitidine HCl (ACID CONTROL PO) Take 1 tablet by mouth daily as needed (acid reflux).    Historical Provider, MD   BP 127/99 mmHg  Pulse 97  Temp(Src) 98.2 F (36.8 C) (Oral)  Resp 18  Ht 5' 9.75" (1.772 m)  Wt 170 lb (77.111 kg)  BMI 24.56 kg/m2  SpO2 100% Physical Exam  Constitutional: She is oriented to person, place,  and time. She appears well-developed and well-nourished. No distress.  HENT:  Head: Normocephalic and atraumatic.  Mouth/Throat: Oropharynx is clear and moist.  Eyes: Conjunctivae and EOM are normal. Pupils are equal, round, and reactive to light.  Neck: Normal range of motion. Neck supple.  Cardiovascular: Normal rate, regular rhythm and intact distal pulses.   No murmur heard. Pulmonary/Chest: Effort normal  and breath sounds normal. No respiratory distress. She has no wheezes. She has no rales.  Abdominal: Soft. Normal appearance. She exhibits no distension. There is tenderness in the suprapubic area. There is no rebound, no guarding and no CVA tenderness.  Genitourinary: Uterus is enlarged. Cervix exhibits no motion tenderness. Right adnexum displays no mass, no tenderness and no fullness. Left adnexum displays no mass, no tenderness and no fullness. There is bleeding in the vagina.  Multiple clots at the cervical os with dark and bright red blood pooling in the vaginal vault. When pressure applied to the uterus more clot and bright red blood expressed. Once all clot removed unable to further express blood from  Musculoskeletal: Normal range of motion. She exhibits no edema or tenderness.  Neurological: She is alert and oriented to person, place, and time.  Skin: Skin is warm and dry. No rash noted. No erythema. No pallor.  Psychiatric: She has a normal mood and affect. Her behavior is normal.  Nursing note and vitals reviewed.   ED Course  Procedures (including critical care time) Labs Review Labs Reviewed  CBC WITH DIFFERENTIAL/PLATELET - Abnormal; Notable for the following:    RBC 3.64 (*)    Hemoglobin 10.2 (*)    HCT 31.9 (*)    All other components within normal limits  COMPREHENSIVE METABOLIC PANEL - Abnormal; Notable for the following:    Potassium 3.4 (*)    Glucose, Bld 129 (*)    Creatinine, Ser 1.08 (*)    Calcium 8.8 (*)    GFR calc non Af Amer 59 (*)    All other components within normal limits    Imaging Review No results found. I have personally reviewed and evaluated these images and lab results as part of my medical decision-making.   EKG Interpretation None      MDM   Final diagnoses:  Menopausal menorrhagia    Patient is a 49 year old female with no medical history except for uterine fibroids who presents today with worsening vaginal bleeding. She states  over the last 6 months her bleeding has become severe. She is bleeding almost 28 days out of every month requiring multiple tampons hourly. Today she was going through 10 tampons and 2-1/2 hours with persistent bleeding and came here because of feeling lightheaded, diffusely weak and mildly short of breath.  Patient currently is taking no medication and denies ever requiring blood transfusion for uterine bleeding.  Patient does have an OB/GYN who she saw proximally one year ago.  On exam patient has some suprapubic tenderness but denies any fever, vaginal discharge other than blood, dysuria. She has no flank pain or other concerning abdominal findings. Vital signs are stable. On vaginal exam patient has multiple clots removed from the cervical os as well as bright blood pooling in the vaginal vault. Once clots were removed from the os unable to express blood from the os.  Hemoglobin today is 10.2 with an unremarkable CMP. Patient does not need blood transfusion however think it imperative that she follow-up with OB/GYN. Will start patient on iron tablets as well as Provera to see if  he can get bleeding slowed down.    Blanchie Dessert, MD 12/09/14 5844  Blanchie Dessert, MD 12/09/14 1712

## 2015-04-12 ENCOUNTER — Observation Stay (HOSPITAL_COMMUNITY): Payer: Self-pay

## 2015-04-12 ENCOUNTER — Observation Stay (HOSPITAL_COMMUNITY)
Admission: EM | Admit: 2015-04-12 | Discharge: 2015-04-13 | Disposition: A | Discharge status: Home / Self Care | Payer: Self-pay | Attending: Internal Medicine | Admitting: Internal Medicine

## 2015-04-12 ENCOUNTER — Encounter (HOSPITAL_COMMUNITY): Payer: Self-pay | Admitting: *Deleted

## 2015-04-12 ENCOUNTER — Emergency Department (HOSPITAL_COMMUNITY): Payer: Self-pay

## 2015-04-12 DIAGNOSIS — Z87891 Personal history of nicotine dependence: Secondary | ICD-10-CM | POA: Insufficient documentation

## 2015-04-12 DIAGNOSIS — Z23 Encounter for immunization: Secondary | ICD-10-CM | POA: Insufficient documentation

## 2015-04-12 DIAGNOSIS — N938 Other specified abnormal uterine and vaginal bleeding: Secondary | ICD-10-CM

## 2015-04-12 DIAGNOSIS — R55 Syncope and collapse: Secondary | ICD-10-CM

## 2015-04-12 DIAGNOSIS — R7989 Other specified abnormal findings of blood chemistry: Secondary | ICD-10-CM | POA: Insufficient documentation

## 2015-04-12 DIAGNOSIS — R58 Hemorrhage, not elsewhere classified: Secondary | ICD-10-CM

## 2015-04-12 DIAGNOSIS — D62 Acute posthemorrhagic anemia: Principal | ICD-10-CM | POA: Insufficient documentation

## 2015-04-12 DIAGNOSIS — D259 Leiomyoma of uterus, unspecified: Secondary | ICD-10-CM | POA: Diagnosis present

## 2015-04-12 DIAGNOSIS — D649 Anemia, unspecified: Secondary | ICD-10-CM

## 2015-04-12 DIAGNOSIS — N179 Acute kidney failure, unspecified: Secondary | ICD-10-CM

## 2015-04-12 DIAGNOSIS — R079 Chest pain, unspecified: Secondary | ICD-10-CM | POA: Insufficient documentation

## 2015-04-12 DIAGNOSIS — R42 Dizziness and giddiness: Secondary | ICD-10-CM | POA: Insufficient documentation

## 2015-04-12 DIAGNOSIS — R0602 Shortness of breath: Secondary | ICD-10-CM | POA: Insufficient documentation

## 2015-04-12 HISTORY — DX: Excessive and frequent menstruation with regular cycle: N92.0

## 2015-04-12 HISTORY — DX: Iron deficiency anemia, unspecified: D50.9

## 2015-04-12 HISTORY — DX: Leiomyoma of uterus, unspecified: D25.9

## 2015-04-12 LAB — BASIC METABOLIC PANEL
Anion gap: 6 (ref 5–15)
BUN: 13 mg/dL (ref 6–20)
CALCIUM: 8.4 mg/dL — AB (ref 8.9–10.3)
CHLORIDE: 110 mmol/L (ref 101–111)
CO2: 22 mmol/L (ref 22–32)
CREATININE: 1.27 mg/dL — AB (ref 0.44–1.00)
GFR calc Af Amer: 56 mL/min — ABNORMAL LOW (ref >60)
GFR calc non Af Amer: 48 mL/min — ABNORMAL LOW (ref >60)
Glucose, Bld: 102 mg/dL — ABNORMAL HIGH (ref 65–99)
Potassium: 3.9 mmol/L (ref 3.5–5.1)
SODIUM: 138 mmol/L (ref 135–145)

## 2015-04-12 LAB — CBC
HCT: 18.9 % — ABNORMAL LOW (ref 36.0–46.0)
HCT: 26.5 % — ABNORMAL LOW (ref 36.0–46.0)
Hemoglobin: 5.7 g/dL — CL (ref 12.0–15.0)
Hemoglobin: 8.6 g/dL — ABNORMAL LOW (ref 12.0–15.0)
MCH: 22.6 pg — AB (ref 26.0–34.0)
MCH: 25.1 pg — AB (ref 26.0–34.0)
MCHC: 30.2 g/dL (ref 30.0–36.0)
MCHC: 32.5 g/dL (ref 30.0–36.0)
MCV: 75 fL — AB (ref 78.0–100.0)
MCV: 77.3 fL — ABNORMAL LOW (ref 78.0–100.0)
PLATELETS: 259 10*3/uL (ref 150–400)
PLATELETS: 248 10*3/uL (ref 150–400)
RBC: 2.52 MIL/uL — ABNORMAL LOW (ref 3.87–5.11)
RBC: 3.43 MIL/uL — ABNORMAL LOW (ref 3.87–5.11)
RDW: 17 % — AB (ref 11.5–15.5)
RDW: 16.8 % — ABNORMAL HIGH (ref 11.5–15.5)
WBC: 3.4 10*3/uL — AB (ref 4.0–10.5)
WBC: 5.2 10*3/uL (ref 4.0–10.5)

## 2015-04-12 LAB — IRON AND TIBC
Iron: 14 ug/dL — ABNORMAL LOW (ref 28–170)
Saturation Ratios: 3 % — ABNORMAL LOW (ref 10.4–31.8)
TIBC: 421 ug/dL (ref 250–450)
UIBC: 407 ug/dL

## 2015-04-12 LAB — RETICULOCYTES
RBC.: 2.55 MIL/uL — ABNORMAL LOW (ref 3.87–5.11)
Retic Count, Absolute: 63.8 10*3/uL (ref 19.0–186.0)
Retic Ct Pct: 2.5 % (ref 0.4–3.1)

## 2015-04-12 LAB — HEPATIC FUNCTION PANEL
ALT: 20 U/L (ref 14–54)
AST: 19 U/L (ref 15–41)
Albumin: 4 g/dL (ref 3.5–5.0)
Alkaline Phosphatase: 53 U/L (ref 38–126)
TOTAL PROTEIN: 6.7 g/dL (ref 6.5–8.1)
Total Bilirubin: 0.4 mg/dL (ref 0.3–1.2)

## 2015-04-12 LAB — TROPONIN I

## 2015-04-12 LAB — FERRITIN: Ferritin: 2 ng/mL — ABNORMAL LOW (ref 11–307)

## 2015-04-12 LAB — TSH: TSH: 0.5 u[IU]/mL (ref 0.350–4.500)

## 2015-04-12 LAB — MRSA PCR SCREENING: MRSA by PCR: NEGATIVE

## 2015-04-12 LAB — PREPARE RBC (CROSSMATCH)

## 2015-04-12 LAB — PROTIME-INR
INR: 1.02 (ref 0.00–1.49)
PROTHROMBIN TIME: 13.6 s (ref 11.6–15.2)

## 2015-04-12 LAB — ABO/RH: ABO/RH(D): O POS

## 2015-04-12 LAB — FOLATE: Folate: 9.5 ng/mL (ref >5.9)

## 2015-04-12 LAB — APTT: APTT: 30 s (ref 24–37)

## 2015-04-12 LAB — VITAMIN B12: Vitamin B-12: 174 pg/mL — ABNORMAL LOW (ref 180–914)

## 2015-04-12 MED ORDER — INFLUENZA VAC SPLIT QUAD 0.5 ML IM SUSY
0.5000 mL | PREFILLED_SYRINGE | INTRAMUSCULAR | Status: AC
Start: 2015-04-13 — End: 2015-04-13
  Administered 2015-04-13: 0.5 mL via INTRAMUSCULAR
  Filled 2015-04-12: qty 0.5

## 2015-04-12 MED ORDER — POTASSIUM CHLORIDE IN NACL 20-0.9 MEQ/L-% IV SOLN
INTRAVENOUS | Status: DC
  Administered 2015-04-12: 19:00:00 via INTRAVENOUS
  Filled 2015-04-12: qty 1000

## 2015-04-12 MED ORDER — ALUM & MAG HYDROXIDE-SIMETH 200-200-20 MG/5ML PO SUSP: 30.0000 mL | Freq: Four times a day (QID) | ORAL | Status: DC | PRN

## 2015-04-12 MED ORDER — ONDANSETRON HCL 4 MG PO TABS: 4.0000 mg | ORAL_TABLET | Freq: Four times a day (QID) | ORAL | Status: DC | PRN

## 2015-04-12 MED ORDER — DOCUSATE SODIUM 100 MG PO CAPS
100.0000 mg | ORAL_CAPSULE | Freq: Two times a day (BID) | ORAL | Status: DC
  Filled 2015-04-12: qty 1

## 2015-04-12 MED ORDER — MEGESTROL ACETATE 40 MG PO TABS
40.0000 mg | ORAL_TABLET | Freq: Once | ORAL | Status: AC
  Administered 2015-04-12: 40 mg via ORAL
  Filled 2015-04-12: qty 1

## 2015-04-12 MED ORDER — FERROUS SULFATE 325 (65 FE) MG PO TABS
325.0000 mg | ORAL_TABLET | Freq: Two times a day (BID) | ORAL | Status: DC
  Administered 2015-04-13: 325 mg via ORAL
  Filled 2015-04-12: qty 1

## 2015-04-12 MED ORDER — ACETAMINOPHEN 650 MG RE SUPP: 650.0000 mg | Freq: Four times a day (QID) | RECTAL | Status: DC | PRN

## 2015-04-12 MED ORDER — ALBUTEROL SULFATE (2.5 MG/3ML) 0.083% IN NEBU: 2.5000 mg | INHALATION_SOLUTION | RESPIRATORY_TRACT | Status: DC | PRN

## 2015-04-12 MED ORDER — ACETAMINOPHEN 325 MG PO TABS
650.0000 mg | ORAL_TABLET | Freq: Four times a day (QID) | ORAL | Status: DC | PRN
Start: 2015-04-12 — End: 2015-04-13

## 2015-04-12 MED ORDER — OXYCODONE HCL 5 MG PO TABS: 5.0000 mg | ORAL_TABLET | ORAL | Status: DC | PRN

## 2015-04-12 MED ORDER — ONDANSETRON HCL 4 MG/2ML IJ SOLN: 4.0000 mg | Freq: Four times a day (QID) | INTRAMUSCULAR | Status: DC | PRN

## 2015-04-12 MED ORDER — SODIUM CHLORIDE 0.9 % IV SOLN
10.0000 mL/h | Freq: Once | INTRAVENOUS | Status: AC
  Administered 2015-04-12: 10 mL/h via INTRAVENOUS

## 2015-04-12 NOTE — ED Provider Notes (Signed)
CSN: [id number]     Arrival date & time 04/12/15  1045 History  By signing my name below, I, Meriel Pica, attest that this documentation has been prepared under the direction and in the presence of Virgel Manifold, MD. Electronically Signed: Meriel Pica, ED Scribe. 04/12/2015. 12:04 PM.   Chief Complaint  Patient presents with  . Chest Pain   The history is provided by the patient. No language interpreter was used.   HPI Comments: Shelley Doyle is a 50 y.o. female who presents to the Emergency Department complaining of SOB that has been intermittent X 3 days with associated generalized weakness and fatigue with normal activities. Pt notes a pre-syncopal episode this morning that she describes as feeling dizzy and light-headed prompting her ED evaluation. She states she is pre-menopausal and menstruates heavily for 3-4 weeks at a time. She is currently menstruating. Pt also notes becoming ill with nausea, diarrhea and emesis 4 days ago that has resolved. Pt denies blood in stool or melena. She does not take a daily Iron supplement. No h/o blood transfusion.    History reviewed. No pertinent past medical history. Past Surgical History  Procedure Laterality Date  . Tubal ligation     No family history on file. Social History  Substance Use Topics  . Smoking status: Former Research scientist (life sciences)  . Smokeless tobacco: None  . Alcohol Use: No   OB History    No data available     Review of Systems  Constitutional: Positive for fatigue.  Respiratory: Positive for shortness of breath.   Gastrointestinal: Negative for blood in stool.  Neurological: Positive for dizziness, weakness ( generalized) and light-headedness.   A complete 10 system review of systems was obtained and is otherwise negative except at noted in the HPI and PMH.  Allergies  Review of patient's allergies indicates no known allergies.  Home Medications   Prior to Admission medications   Medication Sig Start Date End Date  Taking? Authorizing Provider  anti-nausea (EMETROL) solution Take 10 mLs by mouth 2 (two) times daily as needed for nausea or vomiting (medication was only taken twice).    Historical Provider, MD  cephALEXin (KEFLEX) 500 MG capsule Take 1 capsule (500 mg total) by mouth 4 (four) times daily. 06/13/13   Milton Ferguson, MD  ferrous sulfate 325 (65 FE) MG tablet Take 1 tablet (325 mg total) by mouth daily. 12/09/14   Blanchie Dessert, MD  HYDROcodone-acetaminophen (NORCO/VICODIN) 5-325 MG per tablet Take 1 tablet by mouth every 6 (six) hours as needed for moderate pain. 06/13/13   Milton Ferguson, MD  medroxyPROGESTERone (PROVERA) 10 MG tablet Take 1 tablet (10 mg total) by mouth daily. For 10 days 12/09/14   Blanchie Dessert, MD  MELATONIN PO Take 1 tablet by mouth daily as needed (sleep).    Historical Provider, MD  ondansetron (ZOFRAN ODT) 4 MG disintegrating tablet 4mg  ODT q4 hours prn nausea/vomit 06/13/13   Milton Ferguson, MD  Ranitidine HCl (ACID CONTROL PO) Take 1 tablet by mouth daily as needed (acid reflux).    Historical Provider, MD   BP 132/75 mmHg  Pulse 102  Temp(Src) 98.5 F (36.9 C) (Oral)  Resp 16  Ht 5\' 9"  (1.753 m)  Wt 168 lb (76.204 kg)  BMI 24.80 kg/m2  SpO2 100%  LMP 04/07/2015 Physical Exam  Constitutional: She is oriented to person, place, and time. She appears well-developed and well-nourished. No distress.  HENT:  Head: Normocephalic and atraumatic.  Eyes: EOM are normal.  Neck:  Normal range of motion.  Cardiovascular: Regular rhythm and normal heart sounds.   Mild tachycardia.   Pulmonary/Chest: Effort normal and breath sounds normal.  Abdominal: Soft. She exhibits no distension. There is no tenderness.  Musculoskeletal: Normal range of motion.  Neurological: She is alert and oriented to person, place, and time.  Skin: Skin is warm and dry.  Psychiatric: She has a normal mood and affect. Judgment normal.  Nursing note and vitals reviewed.   ED Course  Procedures    CRITICAL CARE Performed by: Virgel Manifold Total critical care time: 35 minutes Critical care time was exclusive of separately billable procedures and treating other patients. Critical care was necessary to treat or prevent imminent or life-threatening deterioration. Critical care was time spent personally by me on the following activities: development of treatment plan with patient and/or surrogate as well as nursing, discussions with consultants, evaluation of patient's response to treatment, examination of patient, obtaining history from patient or surrogate, ordering and performing treatments and interventions, ordering and review of laboratory studies, ordering and review of radiographic studies, pulse oximetry and re-evaluation of patient's condition.   DIAGNOSTIC STUDIES: Oxygen Saturation is 100% on RA, normal by my interpretation.    COORDINATION OF CARE: 11:27 AM Discussed treatment plan with pt at bedside and pt agreed to plan. Discussed low hemoglobin and plan for admission.    Labs Review Labs Reviewed  BASIC METABOLIC PANEL - Abnormal; Notable for the following:    Glucose, Bld 102 (*)    Creatinine, Ser 1.27 (*)    Calcium 8.4 (*)    GFR calc non Af Amer 48 (*)    GFR calc Af Amer 56 (*)    All other components within normal limits  CBC - Abnormal; Notable for the following:    WBC 3.4 (*)    RBC 2.52 (*)    Hemoglobin 5.7 (*)    HCT 18.9 (*)    MCV 75.0 (*)    MCH 22.6 (*)    RDW 17.0 (*)    All other components within normal limits  RETICULOCYTES - Abnormal; Notable for the following:    RBC. 2.55 (*)    All other components within normal limits  TROPONIN I  VITAMIN B12  FOLATE  IRON AND TIBC  FERRITIN  TYPE AND SCREEN  PREPARE RBC (CROSSMATCH)  ABO/RH  Dg Chest 2 View  04/12/2015  CLINICAL DATA:  Intermittent chest pain and shortness of breath for 3 days. EXAM: CHEST  2 VIEW COMPARISON:  Radiographs 06/13/2013 and 05/24/2009. FINDINGS: Persistent  suboptimal inspiration on the frontal examination with resulting mild bibasilar atelectasis. There is no edema, confluent airspace opacity or pleural effusion. The heart size and mediastinal contours are stable. The bones appear unchanged. IMPRESSION: No active cardiopulmonary process. Electronically Signed   By: Richardean Sale M.D.   On: 04/12/2015 11:53    EKG:  Rhythm: normal sinus Rate: 98 PR: 148 ms QRS: 72 ms QTc: 449 ms ST segments: NS ST changes  I have personally reviewed and evaluated these images and lab results as part of my medical decision-making.   MDM   Final diagnoses:  Symptomatic anemia    49yF with symptomatic anemia. Long hx of DUB. No blood thinners. Will transfuse 2u PRBC. Will check anemia panel. Admit.   I personally preformed the services scribed in my presence. The recorded information has been reviewed is accurate. Virgel Manifold, MD.    Virgel Manifold, MD 04/12/15 [phone]

## 2015-04-12 NOTE — ED Notes (Signed)
Blood flow rate increased to 180 cc/hr. Pt tolerating well.

## 2015-04-12 NOTE — ED Notes (Signed)
Pt comes in with chest pain and shortness of breath that is intermittent since Friday.

## 2015-04-12 NOTE — H&P (Addendum)
[facility] History and Physical  KEYATTA Shelley Doyle [id number] DOB: [birth year] DOA: 04/12/2015  Referring physician: Heating physician, Dr. Wilson Singer PCP: No PCP Per Patient (previously at the health department.)  Chief Complaint: Lightheadedness, shortness of breath, heavy uterine bleeding.  HPI: Shelley Doyle is a 50 y.o. female with a history of dysfunctional uterine bleeding presumed to be secondary to uterine fibroids and iron deficiency anemia, who presents to the ED with a complaint of progressive lightheadedness, shortness of breath, and heavy uterine bleeding. Her symptoms began approximately 3 days ago with easy fatigability with normal activity. Over the past 24 hours, she has become more short of breath, lightheaded, particularly when standing, and generally weak. She felt as though she was going to pass out, but never lost consciousness. She reports being perimenopausal and has had times when she does not have a period at all and other times when she has heavy bleeding daily for 3-4 weeks out of the month. She has been having heavy uterine bleeding for the past couple of weeks with some blood clots showing. She had nausea, one episode of vomiting, and loose stools 4 days ago. She denies abdominal pain. She was told in the past that she had uterine fibroids. She said that she was told that with age, the uterine fibroids would shrink. She has not sought evaluation by a gynecologist. She was seen in the ED on a couple of occasions last year. She was advised to take iron supplementation, but admits not taking them on a regular basis.  In the ED, she was afebrile and hemodynamically stable. Her lab data were significant for creatinine of 1.27, normal troponin I, WBC of 3.4, hemoglobin of 5.7, hematocrit of 18.9, normal PT and PTT. Her chest x-ray revealed no acute cardiopulmonary process. She is being admitted for further evaluation and management.      Review of Systems:  As above in  history present illness. Otherwise, review of systems is negative.  Past Medical History  Diagnosis Date  . Heavy menses   . Iron deficiency anemia    Past Surgical History  Procedure Laterality Date  . Tubal ligation     Social History: She is single. She has 6 children. She is employed. She has no insurance. She quit smoking 2 years ago. She denies alcohol  and illicit drug use.   No Known Allergies  Family history: Her mother died of complication of diabetes and heart disease. Her father is living and has COPD.   Prior to Admission medications   Medication Sig Start Date End Date Taking? Authorizing Provider  MELATONIN PO Take 1 tablet by mouth daily as needed (sleep).   Yes Historical Provider, MD  ranitidine (ZANTAC) 75 MG tablet Take 75 mg by mouth daily as needed for heartburn.   Yes Historical Provider, MD  ferrous sulfate 325 (65 FE) MG tablet Take 1 tablet (325 mg total) by mouth daily. Patient not taking: Reported on 04/12/2015 12/09/14   Blanchie Dessert, MD   Physical Exam: Filed Vitals:   04/12/15 1351 04/12/15 1400 04/12/15 1420 04/12/15 1430  BP: 109/66 109/71 121/77 126/81  Pulse: 86 82 89 87  Temp: 98.2 F (36.8 C)  98.3 F (36.8 C)   TempSrc: Oral  Oral   Resp: 20 15 20 17   Height:      Weight:      SpO2: 97% 99% 98% 100%    Wt Readings from Last 3 Encounters:  04/12/15 76.204 kg (168 lb)  12/09/14  77.111 kg (170 lb)  06/13/13 77.111 kg (170 lb)    General:  Appears calm and comfortable; pleasant 50 year old African-American woman in no acute distress.  Eyes: PERRL, normal lids, irises & conjunctiva; conjunctivae are clear and sclerae are white.  ENT: grossly normal hearing; oropharynx reveals pale because membranes; mildly dry.  Neck: no LAD, masses or thyromegaly Cardiovascular: S1, S2, with a soft systolic murmur. No LE edema. Telemetry: not applicable.   Respiratory: CTA bilaterally, no w/r/r. Normal respiratory effort. Abdomen: soft, positive  bowel sounds, soft, nontender, nondistended.  Skin: no rash or induration seen on limited exam Musculoskeletal: grossly normal tone BUE/BLE Psychiatric: grossly normal mood and affect, speech fluent and appropriate Neurologic: grossly non-focal; cranial nerves II through XII are intact.           Labs on Admission:  Basic Metabolic Panel:  Recent Labs Lab 04/12/15 1125  NA 138  K 3.9  CL 110  CO2 22  GLUCOSE 102*  BUN 13  CREATININE 1.27*  CALCIUM 8.4*   Liver Function Tests: No results for input(s): AST, ALT, ALKPHOS, BILITOT, PROT, ALBUMIN in the last 168 hours. No results for input(s): LIPASE, AMYLASE in the last 168 hours. No results for input(s): AMMONIA in the last 168 hours. CBC:  Recent Labs Lab 04/12/15 1125  WBC 3.4*  HGB 5.7*  HCT 18.9*  MCV 75.0*  PLT 259   Cardiac Enzymes:  Recent Labs Lab 04/12/15 1125  TROPONINI <0.03    BNP (last 3 results) No results for input(s): BNP in the last 8760 hours.  ProBNP (last 3 results) No results for input(s): PROBNP in the last 8760 hours.  CBG: No results for input(s): GLUCAP in the last 168 hours.  Radiological Exams on Admission: Dg Chest 2 View  04/12/2015  CLINICAL DATA:  Intermittent chest pain and shortness of breath for 3 days. EXAM: CHEST  2 VIEW COMPARISON:  Radiographs 06/13/2013 and 05/24/2009. FINDINGS: Persistent suboptimal inspiration on the frontal examination with resulting mild bibasilar atelectasis. There is no edema, confluent airspace opacity or pleural effusion. The heart size and mediastinal contours are stable. The bones appear unchanged. IMPRESSION: No active cardiopulmonary process. Electronically Signed   By: Richardean Sale M.D.   On: 04/12/2015 [id number]    EKG: not applicable.  Assessment/Plan Principal Problem:   Symptomatic anemia Active Problems:   Near syncope   DUB (dysfunctional uterine bleeding)   AKI (acute kidney injury) ([place])   1. This is a 50 year old woman with  a history of dysfunctional uterine bleeding, presumed to be secondary to uterine fibroids, who presents with symptomatic anemia from heavy uterine bleeding. In reviewing her chart, CT scan of her abdomen and pelvis from May 2015 revealed a massive uterine fibroid. Apparently, one of her primary care providers at the health department in the past told her that the fibroids would shrink with time. In the meantime, the patient has had intermittent heavy and prolonged uterine bleeding. She has been noncompliant with ferrous sulfate. She presents with a hemoglobin of 5.7 and presyncopal symptoms secondary to severe anemia. 2. Anemia panel ordered prior to transfusions. 3. ED physician ordered 2 units of packed red blood cell transfusions. Agree. He also gave her 1 dose of Megace, 40 mg. 4. We'll order a CBC following the transfusions and if it is above 8, would not transfuse any further. If at 8 or below, would consider transfusing an additional unit. Start ferrous sulfate twice a day. 5. Will start gentle IV fluids for  hydration as it appears that she has acute kidney injury, likely from prerenal azotemia from blood loss. Will monitor her urine output and renal function. 6. We'll keep her at bed rest until she is transfused and then up with assistance. 7. For further evaluation, will order hepatic function panel, TSH, and pelvic ultrasound. 8. We'll obtain a curbside consult with gynecology and will try to set up an outpatient evaluation.    Code Status: Full code DVT Prophylaxis: SCDs Family Communication: discussed with patient; family not available Disposition Plan: likely discharge to home in the next 24-48 hours  Time spent: 27 minutes  [facility] Pager [phone]

## 2015-04-12 NOTE — ED Notes (Signed)
Patient resting watching movies on her phone. No distress noted.

## 2015-04-13 ENCOUNTER — Encounter (HOSPITAL_COMMUNITY): Payer: Self-pay | Admitting: Internal Medicine

## 2015-04-13 DIAGNOSIS — R58 Hemorrhage, not elsewhere classified: Secondary | ICD-10-CM

## 2015-04-13 DIAGNOSIS — D259 Leiomyoma of uterus, unspecified: Secondary | ICD-10-CM

## 2015-04-13 HISTORY — DX: Leiomyoma of uterus, unspecified: D25.9

## 2015-04-13 LAB — CBC
HEMATOCRIT: 25.7 % — AB (ref 36.0–46.0)
HEMOGLOBIN: 8.2 g/dL — AB (ref 12.0–15.0)
MCH: 24.7 pg — AB (ref 26.0–34.0)
MCHC: 31.9 g/dL (ref 30.0–36.0)
MCV: 77.4 fL — ABNORMAL LOW (ref 78.0–100.0)
Platelets: 254 10*3/uL (ref 150–400)
RBC: 3.32 MIL/uL — ABNORMAL LOW (ref 3.87–5.11)
RDW: 16.8 % — AB (ref 11.5–15.5)
WBC: 4.7 10*3/uL (ref 4.0–10.5)

## 2015-04-13 LAB — TYPE AND SCREEN
ABO/RH(D): O POS
Antibody Screen: NEGATIVE
Unit division: 0
Unit division: 0

## 2015-04-13 LAB — COMPREHENSIVE METABOLIC PANEL
ALK PHOS: 50 U/L (ref 38–126)
ALT: 15 U/L (ref 14–54)
ANION GAP: 6 (ref 5–15)
AST: 13 U/L — ABNORMAL LOW (ref 15–41)
Albumin: 3.5 g/dL (ref 3.5–5.0)
BILIRUBIN TOTAL: 0.5 mg/dL (ref 0.3–1.2)
BUN: 12 mg/dL (ref 6–20)
CALCIUM: 8.3 mg/dL — AB (ref 8.9–10.3)
CHLORIDE: 111 mmol/L (ref 101–111)
CO2: 22 mmol/L (ref 22–32)
Creatinine, Ser: 0.97 mg/dL (ref 0.44–1.00)
GFR calc non Af Amer: >60 mL/min (ref >60)
Glucose, Bld: 101 mg/dL — ABNORMAL HIGH (ref 65–99)
Potassium: 3.8 mmol/L (ref 3.5–5.1)
SODIUM: 139 mmol/L (ref 135–145)
Total Protein: 5.7 g/dL — ABNORMAL LOW (ref 6.5–8.1)

## 2015-04-13 MED ORDER — FERROUS SULFATE 325 (65 FE) MG PO TABS: 325.0000 mg | ORAL_TABLET | Freq: Three times a day (TID) | ORAL | Status: DC

## 2015-04-13 MED ORDER — MEGESTROL ACETATE 40 MG PO TABS: ORAL_TABLET | ORAL | Status: DC

## 2015-04-13 NOTE — Care Management Note (Signed)
Case Management Note  Patient Details  Name: Shelley Doyle MRN: [id number] Date of Birth: 11-Dec-1965  Subjective/Objective:                  Pt is from home, lives with spouse and is ind with ADL's. Pt has no insurance and goes to the [facility] for PCP care. Pt referred to [facility] for financial assistance.   Action/Plan: Pt discharging home today. Pt's Rx on $4 list at [facility]. Pt has f/u appointment with GYN.   Expected Discharge Date:     04/13/2015             Expected Discharge Plan:  Home/Self Care  In-House Referral:  NA  Discharge planning Services  CM Consult  Post Acute Care Choice:  NA Choice offered to:  NA  DME Arranged:    DME Agency:     HH Arranged:    HH Agency:     Status of Service:  Completed, signed off  Medicare Important Message Given:    Date Medicare IM Given:    Medicare IM give by:    Date Additional Medicare IM Given:    Additional Medicare Important Message give by:     If discussed at [place] of Stay Meetings, dates discussed:    Additional Comments:  Sherald Barge, RN 04/13/2015, 1:29 PM

## 2015-04-13 NOTE — Discharge Summary (Signed)
Physician Discharge Summary  Shelley Doyle [id number] DOB: [birth year] DOA: 04/12/2015  PCP: No PCP Per Patient  Admit date: 04/12/2015 Discharge date: 04/13/2015  Time spent: Greater than 30 minutes  Recommendations for Outpatient Follow-up:  1. Recommend follow-up CBC at her next outpatient appointment. 2. Patient was referred to gynecologist, Dr. Glo Herring for further management of her uterine fibroid. 3. Patient's vitamin B12 level slightly low, but this was not addressed while she was in the hospital. Recommend further monitoring and possible B12 IM injections in the outpatient setting. (The patient was called following discharge and advised to take oral vitamin B12 tablets, 1000 g daily until further evaluation).   Discharge Diagnoses:  1. Symptomatic acute blood loss anemia. -Hemoglobin was 5.7 on admission.  -Status post transfusion of 2 units of packed red blood cells. -Hemoglobin 8.2 at the time of discharge. 2. Dysfunctional uterine bleeding causing acute and chronic blood loss anemia. 3. Large 9.0 x 7.0 x 7.6 cm intramural fibroid. 4. Near-syncope secondary to blood loss anemia. 5. Acute kidney injury secondary to prerenal azotemia from chronic blood loss. Resolved.   Discharge Condition: Improved  Diet recommendation: Heart healthy  Filed Weights   04/12/15 2019 04/12/15 2034 04/13/15 0500  Weight: 79.6 kg (175 lb 7.8 oz) 79.6 kg (175 lb 7.8 oz) 79.6 kg (175 lb 7.8 oz)    History of present illness:  The patient is a 50 year old perimenopausal woman with a history of dysfunctional uterine bleeding presumed to be secondary to uterine fibroids and iron deficiency anemia, who presented to the ED with a complaint of progressive lightheadedness, shortness of breath, and heavy uterine bleeding. In the ED, she was afebrile and hemodynamically stable. Her lab data were significant for creatinine of 1.27, normal troponin I, WBC of 3.4, hemoglobin of 5.7, hematocrit of 18.9,  normal PT and PTT. Her chest x-ray revealed no acute cardiopulmonary process. She was admitted for further evaluation and management.  Hospital Course:  The patient was started on packed red blood cell transfusion by the EDP. She also given 40 mg of Megace by the EDP. She was subsequently admitted to the stepdown unit for further evaluation. She was started on IV fluids for treatment of prerenal azotemia in the setting of acute and chronic blood loss from uterine bleeding. For further evaluation, a number studies were ordered. Her vitamin B12 level was marginally low at 192, folate was within normal limits, ferritin was 2, total iron 14, and TIBC 421. Her TSH was within normal limits. Pelvic ultrasound revealed a 9.0 x 7.0 x 7.6 and a meter intramural fibroid with a large submucosal component.  Following IV fluid hydration, her renal function/creatinine normalized.  Following the packed red blood cell transfusions, her hemoglobin improved to 8.6. It was 8.2 at the time of discharge. She had no further uterine bleeding during the hospitalization. I obtained a phone call consultation with gynecologist, Dr. Glo Herring. He recommended continuing Megace at 40 mg 3 times a day for 5 days and then once daily thereafter. The patient was informed of this and instructed. An appointment was made for her to follow-up as a new patient with Dr. Glo Herring next week.  The patient was called about her vitamin B12 deficiency which was not addressed during the hospitalization. She was instructed to take an oral vitamin B12 supplement mcg daily. She voiced understanding. Patient will likely need further studies to confirm true vitamin B12 deficiency.  Procedures:  None  Consultations:  Curbside consult with GYN, Dr. Glo Herring.  Discharge  Exam: Filed Vitals:   04/13/15 0800 04/13/15 0900  BP:    Pulse: 75 76  Temp:    Resp: 18 20   temperature 98.5. Pulse 76. Restoril rate 20. Blood pressure 124/78. Oxygen  saturation 100% on room air.  General: Pleasant 50 year old African-American woman in no acute distress. Cardiovascular: S1, S2, with a soft systolic murmur. Respiratory: Clear to auscultation bilaterally. Abdomen: Positive bowel sounds, soft, nontender, nondistended.  Discharge Instructions   Discharge Instructions    Diet general    Complete by:  As directed      Increase activity slowly    Complete by:  As directed           Current Discharge Medication List    START taking these medications   Details  megestrol (MEGACE) 40 MG tablet Take one tablet 3 times daily for 5 days; then 1 tablet daily thereafter. Qty: 45 tablet, Refills: 0      CONTINUE these medications which have CHANGED   Details  ferrous sulfate 325 (65 FE) MG tablet Take 1 tablet (325 mg total) by mouth 3 (three) times daily with meals.      CONTINUE these medications which have NOT CHANGED   Details  MELATONIN PO Take 1 tablet by mouth daily as needed (sleep).    ranitidine (ZANTAC) 75 MG tablet Take 75 mg by mouth daily as needed for heartburn.       No Known Allergies Follow-up Information    Follow up with Jonnie Kind, MD On 04/20/2015.   Specialties:  Obstetrics and Gynecology, Radiology   Why:  AT 3:00 PM   Contact information:   [place] [address] [phone]        The results of significant diagnostics from this hospitalization (including imaging, microbiology, ancillary and laboratory) are listed below for reference.    Significant Diagnostic Studies: Dg Chest 2 View  04/12/2015  CLINICAL DATA:  Intermittent chest pain and shortness of breath for 3 days. EXAM: CHEST  2 VIEW COMPARISON:  Radiographs 06/13/2013 and 05/24/2009. FINDINGS: Persistent suboptimal inspiration on the frontal examination with resulting mild bibasilar atelectasis. There is no edema, confluent airspace opacity or pleural effusion. The heart size and mediastinal contours are stable. The  bones appear unchanged. IMPRESSION: No active cardiopulmonary process. Electronically Signed   By: Richardean Sale M.D.   On: 04/12/2015 11:53   US Transvaginal Non-ob  04/12/2015  CLINICAL DATA:  Patient with dysfunctional uterine bleeding, intermittent for 1 year. EXAM: TRANSABDOMINAL AND TRANSVAGINAL ULTRASOUND OF PELVIS TECHNIQUE: Both transabdominal and transvaginal ultrasound examinations of the pelvis were performed. Transabdominal technique was performed for global imaging of the pelvis including uterus, ovaries, adnexal regions, and pelvic cul-de-sac. It was necessary to proceed with endovaginal exam following the transabdominal exam to visualize the endometrium. COMPARISON:  CT abdomen pelvis 06/13/2013 FINDINGS: Uterus Measurements: 16.3 x 8.9 x 11.0 cm. Within the central aspect of the uterine fundus there is a 9.0 x 7.0 x 7.6 cm intramural fibroid with a large submucosal component. Endometrium Not visualized. Right ovary Not visualized. Left ovary Measurements: 3.5 x 1.6 x 2.9 cm. Normal appearance/no adnexal mass. Other findings No abnormal free fluid. IMPRESSION: Large dominant fibroid within the uterine fundus, predominantly intramural in location however given the size, also has a submucosal component. The endometrium is not well visualized on current examination given the large dominant fibroid. Electronically Signed   By: Lovey Newcomer M.D.   On: 04/12/2015 16:03   US  Pelvis Complete  04/12/2015  CLINICAL DATA:  Patient with dysfunctional uterine bleeding, intermittent for 1 year. EXAM: TRANSABDOMINAL AND TRANSVAGINAL ULTRASOUND OF PELVIS TECHNIQUE: Both transabdominal and transvaginal ultrasound examinations of the pelvis were performed. Transabdominal technique was performed for global imaging of the pelvis including uterus, ovaries, adnexal regions, and pelvic cul-de-sac. It was necessary to proceed with endovaginal exam following the transabdominal exam to visualize the endometrium.  COMPARISON:  CT abdomen pelvis 06/13/2013 FINDINGS: Uterus Measurements: 16.3 x 8.9 x 11.0 cm. Within the central aspect of the uterine fundus there is a 9.0 x 7.0 x 7.6 cm intramural fibroid with a large submucosal component. Endometrium Not visualized. Right ovary Not visualized. Left ovary Measurements: 3.5 x 1.6 x 2.9 cm. Normal appearance/no adnexal mass. Other findings No abnormal free fluid. IMPRESSION: Large dominant fibroid within the uterine fundus, predominantly intramural in location however given the size, also has a submucosal component. The endometrium is not well visualized on current examination given the large dominant fibroid. Electronically Signed   By: Lovey Newcomer M.D.   On: 04/12/2015 16:03    Microbiology: Recent Results (from the past 240 hour(s))  MRSA PCR Screening     Status: None   Collection Time: 04/12/15  8:30 PM  Result Value Ref Range Status   MRSA by PCR NEGATIVE NEGATIVE Final    Comment:        The GeneXpert MRSA Assay (FDA approved for NASAL specimens only), is one component of a comprehensive MRSA colonization surveillance program. It is not intended to diagnose MRSA infection nor to guide or monitor treatment for MRSA infections.      Labs: Basic Metabolic Panel:  Recent Labs Lab 04/12/15 1125 04/13/15 0439  NA 138 139  K 3.9 3.8  CL 110 111  CO2 22 22  GLUCOSE 102* 101*  BUN 13 12  CREATININE 1.27* 0.97  CALCIUM 8.4* 8.3*   Liver Function Tests:  Recent Labs Lab 04/12/15 1120 04/13/15 0439  AST 19 13*  ALT 20 15  ALKPHOS 53 50  BILITOT 0.4 0.5  PROT 6.7 5.7*  ALBUMIN 4.0 3.5   No results for input(s): LIPASE, AMYLASE in the last 168 hours. No results for input(s): AMMONIA in the last 168 hours. CBC:  Recent Labs Lab 04/12/15 1125 04/12/15 2034 04/13/15 0439  WBC 3.4* 5.2 4.7  HGB 5.7* 8.6* 8.2*  HCT 18.9* 26.5* 25.7*  MCV 75.0* 77.3* 77.4*  PLT 259 248 254   Cardiac Enzymes:  Recent Labs Lab 04/12/15 1125   TROPONINI <0.03   BNP: BNP (last 3 results) No results for input(s): BNP in the last 8760 hours.  ProBNP (last 3 results) No results for input(s): PROBNP in the last 8760 hours.  CBG: No results for input(s): GLUCAP in the last 168 hours.     Signed:  Nicolis Boody MD.  [facility] 04/13/2015, 11:34 AM

## 2015-04-20 ENCOUNTER — Encounter: Payer: Self-pay | Admitting: Obstetrics and Gynecology

## 2015-04-20 ENCOUNTER — Ambulatory Visit (INDEPENDENT_AMBULATORY_CARE_PROVIDER_SITE_OTHER): Payer: Self-pay | Admitting: Obstetrics and Gynecology

## 2015-04-20 VITALS — BP 120/60 | Ht 69.0 in | Wt 177.5 lb

## 2015-04-20 DIAGNOSIS — D250 Submucous leiomyoma of uterus: Secondary | ICD-10-CM

## 2015-04-20 DIAGNOSIS — Z01818 Encounter for other preprocedural examination: Secondary | ICD-10-CM

## 2015-04-20 DIAGNOSIS — D649 Anemia, unspecified: Secondary | ICD-10-CM

## 2015-04-20 NOTE — Progress Notes (Signed)
Patient ID: Shelley Doyle, female   DOB: 07-Oct-1965, 50 y.o.   MRN: [id number]

## 2015-04-20 NOTE — Progress Notes (Signed)
Patient ID: Shelley Doyle, female   DOB: 05-Sep-1965, 50 y.o.   MRN: [id number] Pt here today for follow up from ED visit. Pt states that she wants to discuss what the next step is in her care and what options she may have.

## 2015-04-22 LAB — GC/CHLAMYDIA PROBE AMP
Chlamydia trachomatis, NAA: NEGATIVE
Neisseria gonorrhoeae by PCR: NEGATIVE

## 2015-05-02 ENCOUNTER — Encounter (HOSPITAL_COMMUNITY): Payer: Self-pay | Admitting: Emergency Medicine

## 2015-05-02 ENCOUNTER — Emergency Department (HOSPITAL_COMMUNITY)
Admission: EM | Admit: 2015-05-02 | Discharge: 2015-05-02 | Disposition: A | Discharge status: Home / Self Care | Payer: No Typology Code available for payment source | Attending: Emergency Medicine | Admitting: Emergency Medicine

## 2015-05-02 DIAGNOSIS — Y929 Unspecified place or not applicable: Secondary | ICD-10-CM | POA: Insufficient documentation

## 2015-05-02 DIAGNOSIS — Z87891 Personal history of nicotine dependence: Secondary | ICD-10-CM | POA: Diagnosis not present

## 2015-05-02 DIAGNOSIS — Y999 Unspecified external cause status: Secondary | ICD-10-CM | POA: Insufficient documentation

## 2015-05-02 DIAGNOSIS — Y939 Activity, unspecified: Secondary | ICD-10-CM | POA: Insufficient documentation

## 2015-05-02 DIAGNOSIS — M542 Cervicalgia: Secondary | ICD-10-CM

## 2015-05-02 DIAGNOSIS — S199XXA Unspecified injury of neck, initial encounter: Secondary | ICD-10-CM | POA: Insufficient documentation

## 2015-05-02 DIAGNOSIS — M25511 Pain in right shoulder: Secondary | ICD-10-CM | POA: Diagnosis not present
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
## 2015-05-02 NOTE — Discharge Instructions (Signed)
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
## 2015-05-03 ENCOUNTER — Other Ambulatory Visit: Payer: Self-pay | Admitting: Obstetrics and Gynecology

## 2015-05-03 ENCOUNTER — Encounter: Payer: Self-pay | Admitting: Obstetrics and Gynecology

## 2015-05-03 ENCOUNTER — Ambulatory Visit (INDEPENDENT_AMBULATORY_CARE_PROVIDER_SITE_OTHER): Payer: Self-pay | Admitting: Obstetrics and Gynecology

## 2015-05-03 VITALS — BP 130/90 | Ht 69.0 in | Wt 175.0 lb

## 2015-05-03 DIAGNOSIS — Z3200 Encounter for pregnancy test, result unknown: Secondary | ICD-10-CM

## 2015-05-03 DIAGNOSIS — D251 Intramural leiomyoma of uterus: Secondary | ICD-10-CM

## 2015-05-03 DIAGNOSIS — D649 Anemia, unspecified: Secondary | ICD-10-CM

## 2015-05-03 LAB — POCT URINE PREGNANCY: Preg Test, Ur: NEGATIVE

## 2015-05-03 MED ORDER — MEGESTROL ACETATE 40 MG PO TABS: ORAL_TABLET | ORAL | Status: DC

## 2015-05-03 NOTE — Progress Notes (Addendum)
Patient ID: Shelley Doyle, female   DOB: May 25, 1965, 50 y.o.   MRN: [id number]  Endometrial Biopsy: Patient given informed consent, signed copy in the chart, time out was performed. Time out taken. The patient was placed in the lithotomy position and the cervix brought into view with sterile speculum.  Portio of cervix cleansed x 2 with betadine swabs.  A tenaculum was placed in the anterior lip of the cervix. The uterus was sounded for depth of 12 cm,. Milex uterine Explora 3 mm was introduced to into the uterus, suction created,  and an endometrial sample was obtained. All equipment was removed and accounted for.   5 cc of blood with MINIMAL tissue obtained. The patient tolerated the procedure well.   Patient given post procedure instructions.  Followup: by phone. For pathology  Plan is for pt to apply for insurance in OCT. She wants to use megace til then, if it works.  By signing my name below, I, Terressa Koyanagi, attest that this documentation has been prepared under the direction and in the presence of Mallory Shirk, MD. Electronically Signed: Terressa Koyanagi, ED Scribe. 05/03/2015. 10:21 AM.   I personally performed the services described in this documentation, which was SCRIBED in my presence. The recorded information has been reviewed and considered accurate. It has been edited as necessary during review. Jonnie Kind, MD

## 2015-05-10 ENCOUNTER — Telehealth: Payer: Self-pay | Admitting: Obstetrics and Gynecology

## 2015-05-10 NOTE — Telephone Encounter (Signed)
Pt aware of biopsy results. Pt verbalized understanding.

## 2015-05-10 NOTE — Telephone Encounter (Signed)
Pt called stating that she had an endo biopsy on Monday last week and she would like to know the results. Please contact pt

## 2015-05-24 ENCOUNTER — Telehealth: Payer: Self-pay | Admitting: *Deleted

## 2015-05-24 NOTE — Telephone Encounter (Signed)
Pt called stating that she needed a refill of her megace. I advised the pt that when the medication was sent in to her pharmacy it was sent with 5 refills. I also advised the pt to call her pharmacy and see if they could refill it and to call us back if there were any problems. Pt verbalized understanding.

## 2016-04-10 ENCOUNTER — Telehealth: Payer: Self-pay | Admitting: *Deleted

## 2016-04-10 MED ORDER — MEGESTROL ACETATE 40 MG PO TABS: ORAL_TABLET | ORAL | 5 refills | Status: DC

## 2016-04-10 NOTE — Telephone Encounter (Signed)
Patient would like Megace refill. She is scheduled for an appointment next week to be seen to evaluate fibroids but can't come until after Friday when she gets paid. Thanks.

## 2016-04-10 NOTE — Telephone Encounter (Signed)
TID megace refilled, pt agrees to make an appointment.

## 2016-04-17 ENCOUNTER — Ambulatory Visit: Payer: Self-pay | Admitting: Obstetrics and Gynecology

## 2016-04-20 DIAGNOSIS — K006 Disturbances in tooth eruption: Secondary | ICD-10-CM | POA: Diagnosis not present

## 2016-05-10 ENCOUNTER — Ambulatory Visit: Payer: Self-pay | Admitting: Obstetrics and Gynecology

## 2016-07-19 ENCOUNTER — Emergency Department (HOSPITAL_COMMUNITY)
Admission: EM | Admit: 2016-07-19 | Discharge: 2016-07-19 | Disposition: A | Discharge status: Home / Self Care | Payer: Self-pay | Attending: Emergency Medicine | Admitting: Emergency Medicine

## 2016-07-19 ENCOUNTER — Encounter (HOSPITAL_COMMUNITY): Payer: Self-pay | Admitting: *Deleted

## 2016-07-19 ENCOUNTER — Emergency Department (HOSPITAL_COMMUNITY): Payer: Self-pay

## 2016-07-19 DIAGNOSIS — Z87891 Personal history of nicotine dependence: Secondary | ICD-10-CM | POA: Insufficient documentation

## 2016-07-19 DIAGNOSIS — Y929 Unspecified place or not applicable: Secondary | ICD-10-CM | POA: Insufficient documentation

## 2016-07-19 DIAGNOSIS — Y9301 Activity, walking, marching and hiking: Secondary | ICD-10-CM | POA: Insufficient documentation

## 2016-07-19 DIAGNOSIS — Z79899 Other long term (current) drug therapy: Secondary | ICD-10-CM | POA: Insufficient documentation

## 2016-07-19 DIAGNOSIS — S92155A Nondisplaced avulsion fracture (chip fracture) of left talus, initial encounter for closed fracture: Secondary | ICD-10-CM | POA: Insufficient documentation

## 2016-07-19 DIAGNOSIS — R55 Syncope and collapse: Secondary | ICD-10-CM | POA: Insufficient documentation

## 2016-07-19 DIAGNOSIS — Y999 Unspecified external cause status: Secondary | ICD-10-CM | POA: Insufficient documentation

## 2016-07-19 DIAGNOSIS — W19XXXA Unspecified fall, initial encounter: Secondary | ICD-10-CM | POA: Insufficient documentation

## 2016-07-19 MED ORDER — HYDROCODONE-ACETAMINOPHEN 5-325 MG PO TABS: 1.0000 | ORAL_TABLET | ORAL | 0 refills | Status: DC | PRN

## 2016-07-19 MED ORDER — IBUPROFEN 400 MG PO TABS
600.0000 mg | ORAL_TABLET | Freq: Once | ORAL | Status: AC
  Administered 2016-07-19: 600 mg via ORAL
  Filled 2016-07-19: qty 2

## 2016-07-19 NOTE — ED Notes (Signed)
Patient transported to X-ray 

## 2016-07-19 NOTE — ED Triage Notes (Signed)
Pt states that she was coming out of a building from paying her water bill when she fell, unsure of what happened, reports " I woke up on the ground" pt also states that she has been having several falls recently, c/o ankle pain,

## 2016-07-19 NOTE — ED Provider Notes (Signed)
[facility] DEPT Provider Note   CSN: [id number] Arrival date & time: 07/19/16  1924  By signing my name below, I, Levester Fresh, attest that this documentation has been prepared under the direction and in the presence of Virgel Manifold, MD . Electronically Signed: Levester Fresh, Scribe. 07/19/2016. 8:02 PM.  History   Chief Complaint Chief Complaint  Patient presents with  . Loss of Consciousness    HPI Comments Shelley Doyle is a 51 y.o. female with a PMHx significant for dysfunctional uterine bleeding with symptomatic anemia, who presents to the Emergency Department with complaints of sudden onset left foot pain and swelling, after what pt reports to be mechanical fall just PTA.  Pt was coming out of a building after paying her water bill when she presumably tripped down the steps, however she is unable to recall exactly what lead up to the incident. She denies experiencing any dizziness or diaphoresis before falling.  She works 3rd shift and states that she only got a few hours of sleep last night.  Denies neck pain, headache or back pain.  No head impact.  She has been able to ambulate, albeit with discomfort, since the incident.  No numbness or tingling.  No pain above her ankle.  Pt denies experiencing any other acute sx, including abdominal pain, nausea or vomiting.   The history is provided by the patient and medical records. No language interpreter was used.    Past Medical History:  Diagnosis Date  . Heavy menses   . History of blood transfusion   . Iron deficiency anemia   . Uterine fibroid 04/13/2015    Patient Active Problem List   Diagnosis Date Noted  . Uterine fibroid 04/13/2015  . Symptomatic anemia 04/12/2015  . Near syncope 04/12/2015  . DUB (dysfunctional uterine bleeding) 04/12/2015  . AKI (acute kidney injury) ([place]) 04/12/2015    Past Surgical History:  Procedure Laterality Date  . TUBAL LIGATION      OB History    No data available        Home Medications    Prior to Admission medications   Medication Sig Start Date End Date Taking? Authorizing Provider  ferrous sulfate 325 (65 FE) MG tablet Take 1 tablet (325 mg total) by mouth 3 (three) times daily with meals. 04/13/15   Rexene Alberts, MD  megestrol (MEGACE) 40 MG tablet Take one tablet 3 times daily for 5 days; then 1 tablet daily thereafter. 04/10/16   Jonnie Kind, MD  MELATONIN PO Take 1 tablet by mouth daily as needed (sleep).    [provider]  ranitidine (ZANTAC) 75 MG tablet Take 75 mg by mouth daily as needed for heartburn.    [provider]  vitamin B-12 (CYANOCOBALAMIN) 1000 MCG tablet Take 1,000 mcg by mouth daily.    [provider]    Family History Family History  Problem Relation Age of Onset  . Diabetes Mother   . Heart failure Mother   . COPD Father   . Diabetes Sister   . Diabetes Brother     Social History Social History  Substance Use Topics  . Smoking status: Former Research scientist (life sciences)  . Smokeless tobacco: Never Used  . Alcohol use No     Allergies   Patient has no known allergies.   Review of Systems Review of Systems  Constitutional: Negative for diaphoresis.  Gastrointestinal: Negative for abdominal pain, nausea and vomiting.  Musculoskeletal: Positive for arthralgias and joint swelling. Negative for  back pain, gait problem, myalgias and neck pain.  Neurological: Negative for dizziness, weakness, numbness and headaches.  All other systems reviewed and are negative.  Physical Exam Updated Vital Signs BP 129/82 (BP Location: Right Arm)   Pulse (!) 121   Temp 98.6 F (37 C) (Oral)   Resp 18   Ht 5' 9.75" (1.772 m)   Wt 162 lb (73.5 kg)   SpO2 95%   BMI 23.41 kg/m   Physical Exam  Constitutional: She is oriented to person, place, and time. She appears well-developed and well-nourished. No distress.  HENT:  Head: Normocephalic and atraumatic.  Eyes: EOM are normal.  Neck: Normal range of  motion.  Cardiovascular: Normal rate, regular rhythm and normal heart sounds.   Pulmonary/Chest: Effort normal and breath sounds normal.  Abdominal: Soft. She exhibits no distension. There is no tenderness.  Musculoskeletal: Normal range of motion.  Swelling and tenderness to the lateral malleolus of the left ankle and across the dorsum of the foot. No tenderness to the proximal tib-fib. Can wiggle toes.  Palpable DP pulses post-injury.  Neurological: She is alert and oriented to person, place, and time.  Skin: Skin is warm and dry.  Psychiatric: She has a normal mood and affect. Judgment normal.  Nursing note and vitals reviewed.  ED Treatments / Results  DIAGNOSTIC STUDIES: Oxygen Saturation is 95% on room air, adequate by my interpretation.    COORDINATION OF CARE: 7:47 PM Discussed treatment plan with pt at bedside and pt agreed to plan.  Labs (all labs ordered are listed, but only abnormal results are displayed) Labs Reviewed - No data to display  EKG  EKG Interpretation None       Radiology Dg Ankle Complete Left  Result Date: 07/19/2016 CLINICAL DATA:  Inversion injury of the ankle. EXAM: LEFT ANKLE COMPLETE - 3+ VIEW COMPARISON:  07/25/2010 FINDINGS: Soft swelling about the malleoli and dorsum of the midfoot with crescentic bony avulsion off the dorsum of the anterior talus, new since prior. The tibiotalar, subtalar and midfoot articulations are maintained. Base of fifth metatarsal appears intact. Tiny plantar calcaneal enthesophyte is noted. IMPRESSION: New small bony avulsion off the dorsum of the anterior talus with overlying soft tissue swelling. Bimalleolar soft tissue swelling is also present. Tiny plantar calcaneal enthesophyte. Electronically Signed   By: Ashley Royalty M.D.   On: 07/19/2016 20:47   Procedures Procedures (including critical care time)  Medications Ordered in ED Medications  ibuprofen (ADVIL,MOTRIN) tablet 600 mg (not administered)     Initial  Impression / Assessment and Plan / ED Course  I have reviewed the triage vital signs and the nursing notes.  Pertinent labs & imaging results that were available during my care of the patient were reviewed by me and considered in my medical decision making (see chart for details).     Splint. Prn meds. RICE. Ortho FU.   Final Clinical Impressions(s) / ED Diagnoses   Final diagnoses:  Closed nondisplaced avulsion fracture of left talus, initial encounter   I personally preformed the services scribed in my presence. The recorded information has been reviewed is accurate. Virgel Manifold, MD.   New Prescriptions New Prescriptions   No medications on file     Virgel Manifold, MD 07/31/16 1218

## 2016-09-08 ENCOUNTER — Ambulatory Visit: Payer: Managed Care, Other (non HMO) | Admitting: Podiatry

## 2016-12-03 ENCOUNTER — Emergency Department (HOSPITAL_COMMUNITY): Payer: Managed Care, Other (non HMO)

## 2016-12-03 ENCOUNTER — Encounter (HOSPITAL_COMMUNITY): Payer: Self-pay

## 2016-12-03 ENCOUNTER — Emergency Department (HOSPITAL_COMMUNITY)
Admission: EM | Admit: 2016-12-03 | Discharge: 2016-12-03 | Disposition: A | Discharge status: Home / Self Care | Payer: Managed Care, Other (non HMO) | Attending: Emergency Medicine | Admitting: Emergency Medicine

## 2016-12-03 DIAGNOSIS — Y929 Unspecified place or not applicable: Secondary | ICD-10-CM | POA: Insufficient documentation

## 2016-12-03 DIAGNOSIS — Y999 Unspecified external cause status: Secondary | ICD-10-CM | POA: Diagnosis not present

## 2016-12-03 DIAGNOSIS — Y939 Activity, unspecified: Secondary | ICD-10-CM | POA: Diagnosis not present

## 2016-12-03 DIAGNOSIS — W010XXA Fall on same level from slipping, tripping and stumbling without subsequent striking against object, initial encounter: Secondary | ICD-10-CM | POA: Insufficient documentation

## 2016-12-03 DIAGNOSIS — S52502A Unspecified fracture of the lower end of left radius, initial encounter for closed fracture: Secondary | ICD-10-CM

## 2016-12-03 DIAGNOSIS — Z87891 Personal history of nicotine dependence: Secondary | ICD-10-CM | POA: Diagnosis not present

## 2016-12-03 DIAGNOSIS — S52515A Nondisplaced fracture of left radial styloid process, initial encounter for closed fracture: Secondary | ICD-10-CM | POA: Diagnosis not present

## 2016-12-03 DIAGNOSIS — M79642 Pain in left hand: Secondary | ICD-10-CM | POA: Diagnosis not present

## 2016-12-03 DIAGNOSIS — Z79899 Other long term (current) drug therapy: Secondary | ICD-10-CM | POA: Insufficient documentation

## 2016-12-03 DIAGNOSIS — S6992XA Unspecified injury of left wrist, hand and finger(s), initial encounter: Secondary | ICD-10-CM | POA: Diagnosis not present

## 2016-12-03 MED ORDER — IBUPROFEN 400 MG PO TABS
600.0000 mg | ORAL_TABLET | Freq: Once | ORAL | Status: AC
  Administered 2016-12-03: 600 mg via ORAL
  Filled 2016-12-03: qty 2

## 2016-12-03 MED ORDER — TRAMADOL HCL 50 MG PO TABS: 50.0000 mg | ORAL_TABLET | Freq: Four times a day (QID) | ORAL | 0 refills | Status: DC | PRN

## 2016-12-03 NOTE — ED Triage Notes (Signed)
Reports of falling down steps PTA. Complains of left hand pain.

## 2016-12-03 NOTE — ED Provider Notes (Signed)
[facility] EMERGENCY DEPARTMENT Provider Note   CSN: [id number] Arrival date & time: 12/03/16  1445     History   Chief Complaint Chief Complaint  Patient presents with  . Fall    HPI Shelley Doyle is a 51 y.o. female.  HPI   51 year old female with left hand/wrist pain after mechanical fall.  Patient slipped and fell backwards.  She put her hands down to brace her fall.  She has had persistent pain in her left wrist and hand since then.  Denies pain elsewhere.  No numbness or tingling.  No interventions prior to arrival.  Past Medical History:  Diagnosis Date  . Heavy menses   . History of blood transfusion   . Iron deficiency anemia   . Uterine fibroid 04/13/2015    Patient Active Problem List   Diagnosis Date Noted  . Uterine fibroid 04/13/2015  . Symptomatic anemia 04/12/2015  . Near syncope 04/12/2015  . DUB (dysfunctional uterine bleeding) 04/12/2015  . AKI (acute kidney injury) ([place]) 04/12/2015    Past Surgical History:  Procedure Laterality Date  . TUBAL LIGATION      OB History    No data available       Home Medications    Prior to Admission medications   Medication Sig Start Date End Date Taking? Authorizing Provider  HYDROcodone-acetaminophen (NORCO/VICODIN) 5-325 MG tablet Take 1-2 tablets by mouth every 4 (four) hours as needed. 07/19/16   Virgel Manifold, MD  megestrol (MEGACE) 40 MG tablet Take one tablet 3 times daily for 5 days; then 1 tablet daily thereafter. 04/10/16   Jonnie Kind, MD  MELATONIN PO Take 1 tablet by mouth daily as needed (sleep).    [provider]    Family History Family History  Problem Relation Age of Onset  . Diabetes Mother   . Heart failure Mother   . COPD Father   . Diabetes Sister   . Diabetes Brother     Social History Social History  Substance Use Topics  . Smoking status: Former Research scientist (life sciences)  . Smokeless tobacco: Never Used  . Alcohol use No     Allergies   Patient has no known  allergies.   Review of Systems Review of Systems  All systems reviewed and negative, other than as noted in HPI.   Physical Exam Updated Vital Signs BP 129/81 (BP Location: Right Arm)   Pulse 96   Temp 98.6 F (37 C) (Oral)   Resp 16   Ht 5\' 10"  (1.778 m)   Wt 74.8 kg (165 lb)   SpO2 99%   BMI 23.68 kg/m   Physical Exam  Constitutional: She appears well-developed and well-nourished. No distress.  HENT:  Head: Normocephalic and atraumatic.  Eyes: Conjunctivae are normal. Right eye exhibits no discharge. Left eye exhibits no discharge.  Neck: Neck supple.  Cardiovascular: Normal rate, regular rhythm and normal heart sounds.  Exam reveals no gallop and no friction rub.   No murmur heard. Pulmonary/Chest: Effort normal and breath sounds normal. No respiratory distress.  Abdominal: Soft. She exhibits no distension. There is no tenderness.  Musculoskeletal: She exhibits edema and tenderness.  Mild swelling and exquisite tenderness on the distal left forearm and wrist.  She cannot actively range the wrist secondary to pain.  She can move all fingers.  Sensation is light touch.  Palpable radial pulse.  Closed injury.  Neurological: She is alert.  Skin: Skin is warm and dry.  Psychiatric: She has a normal  mood and affect. Her behavior is normal. Thought content normal.  Nursing note and vitals reviewed.    ED Treatments / Results  Labs (all labs ordered are listed, but only abnormal results are displayed) Labs Reviewed - No data to display  EKG  EKG Interpretation None       Radiology No results found.  Procedures Procedures (including critical care time)  Medications Ordered in ED Medications  ibuprofen (ADVIL,MOTRIN) tablet 600 mg (not administered)     Initial Impression / Assessment and Plan / ED Course  I have reviewed the triage vital signs and the nursing notes.  Pertinent labs & imaging results that were available during my care of the patient were  reviewed by me and considered in my medical decision making (see chart for details).     51 year old female with left hand/wrist pain after fall.  She has a closed, nondisplaced distal radius fracture.  She was splinted.  As needed pain medication.  Orthopedic follow-up this upcoming week.  Return precautions discussed.  Final Clinical Impressions(s) / ED Diagnoses   Final diagnoses:  Closed fracture of distal end of left radius, unspecified fracture morphology, initial encounter    New Prescriptions New Prescriptions   No medications on file     Virgel Manifold, MD 12/03/16 1620

## 2016-12-11 ENCOUNTER — Telehealth: Payer: Self-pay | Admitting: Orthopedic Surgery

## 2016-12-11 ENCOUNTER — Ambulatory Visit: Payer: Managed Care, Other (non HMO) | Admitting: Orthopedic Surgery

## 2016-12-11 ENCOUNTER — Encounter: Payer: Self-pay | Admitting: Orthopedic Surgery

## 2016-12-11 ENCOUNTER — Ambulatory Visit: Payer: Managed Care, Other (non HMO)

## 2016-12-11 VITALS — BP 138/96 | HR 82 | Ht 70.0 in | Wt 177.0 lb

## 2016-12-11 DIAGNOSIS — S52592A Other fractures of lower end of left radius, initial encounter for closed fracture: Secondary | ICD-10-CM

## 2016-12-11 MED ORDER — TRAMADOL HCL 50 MG PO TABS: 50.0000 mg | ORAL_TABLET | Freq: Four times a day (QID) | ORAL | 0 refills | Status: DC | PRN

## 2016-12-11 NOTE — Progress Notes (Signed)
  NEW PATIENT OFFICE VISIT    Chief Complaint  Patient presents with  . Wrist Pain    fracture 12/03/16    51 years old female right-hand-dominant injured her left wrist comes in complaining of pain for the last 8 days  Her pain is mild dull constant over the left wrist.  She fell down her stairs at home coming out of the garage    Review of Systems  Constitutional: Negative.   Musculoskeletal: Negative.   Skin: Negative.   Neurological: Negative for tingling and sensory change.     Past Medical History:  Diagnosis Date  . Heavy menses   . History of blood transfusion   . Iron deficiency anemia   . Uterine fibroid 04/13/2015    Past Surgical History:  Procedure Laterality Date  . TUBAL LIGATION      Family History  Problem Relation Age of Onset  . Diabetes Mother   . Heart failure Mother   . COPD Father   . Diabetes Sister   . Diabetes Brother    Social History   Tobacco Use  . Smoking status: Former Research scientist (life sciences)  . Smokeless tobacco: Never Used  Substance Use Topics  . Alcohol use: No  . Drug use: No      Current Meds  Medication Sig  . megestrol (MEGACE) 40 MG tablet Take one tablet 3 times daily for 5 days; then 1 tablet daily thereafter.    BP (!) 138/96   Pulse 82   Ht 5\' 10"  (1.778 m)   Wt 177 lb (80.3 kg)   BMI 25.40 kg/m   Physical Exam  Constitutional: She is oriented to person, place, and time. She appears well-developed and well-nourished.  Musculoskeletal:       Arms:      Left hand: Normal sensation noted. Decreased sensation is not present in the ulnar distribution, is not present in the medial redistribution and is not present in the radial distribution.  Neurological: She is alert and oriented to person, place, and time.  Psychiatric: She has a normal mood and affect. Judgment normal.  Vitals reviewed.   Ortho Exam  No orders of the defined types were placed in this encounter.  Her x-ray shows a nondisplaced distal radius  fracture   Encounter Diagnosis  Name Primary?  . Other closed fracture of distal end of left radius, initial encounter Yes     PLAN:   Short arm cast 6 weeks, 6 weeks x-ray out of plaster

## 2016-12-11 NOTE — Telephone Encounter (Signed)
Patient called this morning stating she was seen at [facility] on 12/03/16 and sustained a fracture of her left hand/wrist.  She was told to contact our office for an appointment.  I asked her why she had not called [place] last week and she said she didn't have the copay for her insurance.  I told her that we would have worked with her.  Luckily for her, she had a cancellation for this afternoon and she was told to be here at 3:00

## 2017-01-18 DIAGNOSIS — S52502A Unspecified fracture of the lower end of left radius, initial encounter for closed fracture: Secondary | ICD-10-CM | POA: Insufficient documentation

## 2017-01-22 ENCOUNTER — Encounter: Payer: Self-pay | Admitting: Orthopedic Surgery

## 2017-01-22 ENCOUNTER — Ambulatory Visit (INDEPENDENT_AMBULATORY_CARE_PROVIDER_SITE_OTHER): Payer: Managed Care, Other (non HMO) | Admitting: Orthopedic Surgery

## 2017-01-22 ENCOUNTER — Ambulatory Visit (INDEPENDENT_AMBULATORY_CARE_PROVIDER_SITE_OTHER): Payer: Managed Care, Other (non HMO)

## 2017-01-22 DIAGNOSIS — S52592A Other fractures of lower end of left radius, initial encounter for closed fracture: Secondary | ICD-10-CM | POA: Diagnosis not present

## 2017-01-22 NOTE — Patient Instructions (Signed)
Remove brace 3 times a day move wrist 15-20 times each direction.

## 2017-01-22 NOTE — Progress Notes (Signed)
Fracture care follow-up  Chief Complaint  Patient presents with  . Wrist Injury    12/03/16 date of fracture left distal radius    Encounter Diagnosis  Name Primary?  . Other closed fracture of distal end of left radius, initial encounter 12/03/16 Yes     Current Outpatient Medications:  .  megestrol (MEGACE) 40 MG tablet, Take one tablet 3 times daily for 5 days; then 1 tablet daily thereafter., Disp: 45 tablet, Rfl: 5 .  MELATONIN PO, Take 1 tablet by mouth daily as needed (sleep)., Disp: , Rfl:  .  traMADol (ULTRAM) 50 MG tablet, Take 1 tablet (50 mg total) every 6 (six) hours as needed by mouth., Disp: 30 tablet, Rfl: 0 .  HYDROcodone-acetaminophen (NORCO/VICODIN) 5-325 MG tablet, Take 1-2 tablets by mouth every 4 (four) hours as needed. (Patient not taking: Reported on 12/11/2016), Disp: 15 tablet, Rfl: 0  There were no vitals taken for this visit.  Physical Exam  Constitutional: She is oriented to person, place, and time. She appears well-developed and well-nourished.  Musculoskeletal:  Wrist tender on the ulnar side no tenderness at the fracture site mild pain in the wrist joint with range of motion  Neurological: She is alert and oriented to person, place, and time.  Skin: Skin is warm and dry.  Psychiatric: She has a normal mood and affect.     Xrays: Fracture healed without displacement or malalignment   recommend wrist splint and exercises follow-up 6 weeks

## 2017-03-05 ENCOUNTER — Encounter: Payer: Self-pay | Admitting: Orthopedic Surgery

## 2017-03-05 ENCOUNTER — Ambulatory Visit (INDEPENDENT_AMBULATORY_CARE_PROVIDER_SITE_OTHER): Payer: Managed Care, Other (non HMO) | Admitting: Orthopedic Surgery

## 2017-03-05 VITALS — BP 124/83 | HR 101 | Ht 70.0 in | Wt 177.0 lb

## 2017-03-05 DIAGNOSIS — S52592D Other fractures of lower end of left radius, subsequent encounter for closed fracture with routine healing: Secondary | ICD-10-CM

## 2017-03-05 NOTE — Progress Notes (Signed)
Fracture care follow-up to check range of motion status post distal radius fracture  The only issue she is having right now is that her supination has not returned to normal.  Otherwise she is doing well except she burned her hand and has a blister.  I advised Neosporin gauze cover and follow-up with [place] as needed

## 2017-05-15 ENCOUNTER — Other Ambulatory Visit: Payer: Self-pay | Admitting: Obstetrics and Gynecology

## 2017-06-22 ENCOUNTER — Ambulatory Visit: Payer: Managed Care, Other (non HMO) | Admitting: Obstetrics and Gynecology

## 2017-07-23 ENCOUNTER — Ambulatory Visit: Payer: Managed Care, Other (non HMO) | Admitting: Obstetrics and Gynecology

## 2017-07-23 ENCOUNTER — Telehealth: Payer: Self-pay | Admitting: Obstetrics and Gynecology

## 2017-07-23 NOTE — Telephone Encounter (Signed)
Called to see if pt would be keeping appt today. NO answer . Left message

## 2017-10-17 ENCOUNTER — Ambulatory Visit: Payer: Managed Care, Other (non HMO) | Admitting: Orthopedic Surgery

## 2018-03-19 IMAGING — DX DG HAND COMPLETE 3+V*L*
3 series · 3 of 3 positions shown · non-contrast
Comparison: None.

CLINICAL DATA: Fall, left hand and wrist pain

EXAM:
LEFT HAND - COMPLETE 3+ VIEW

[hand pa]
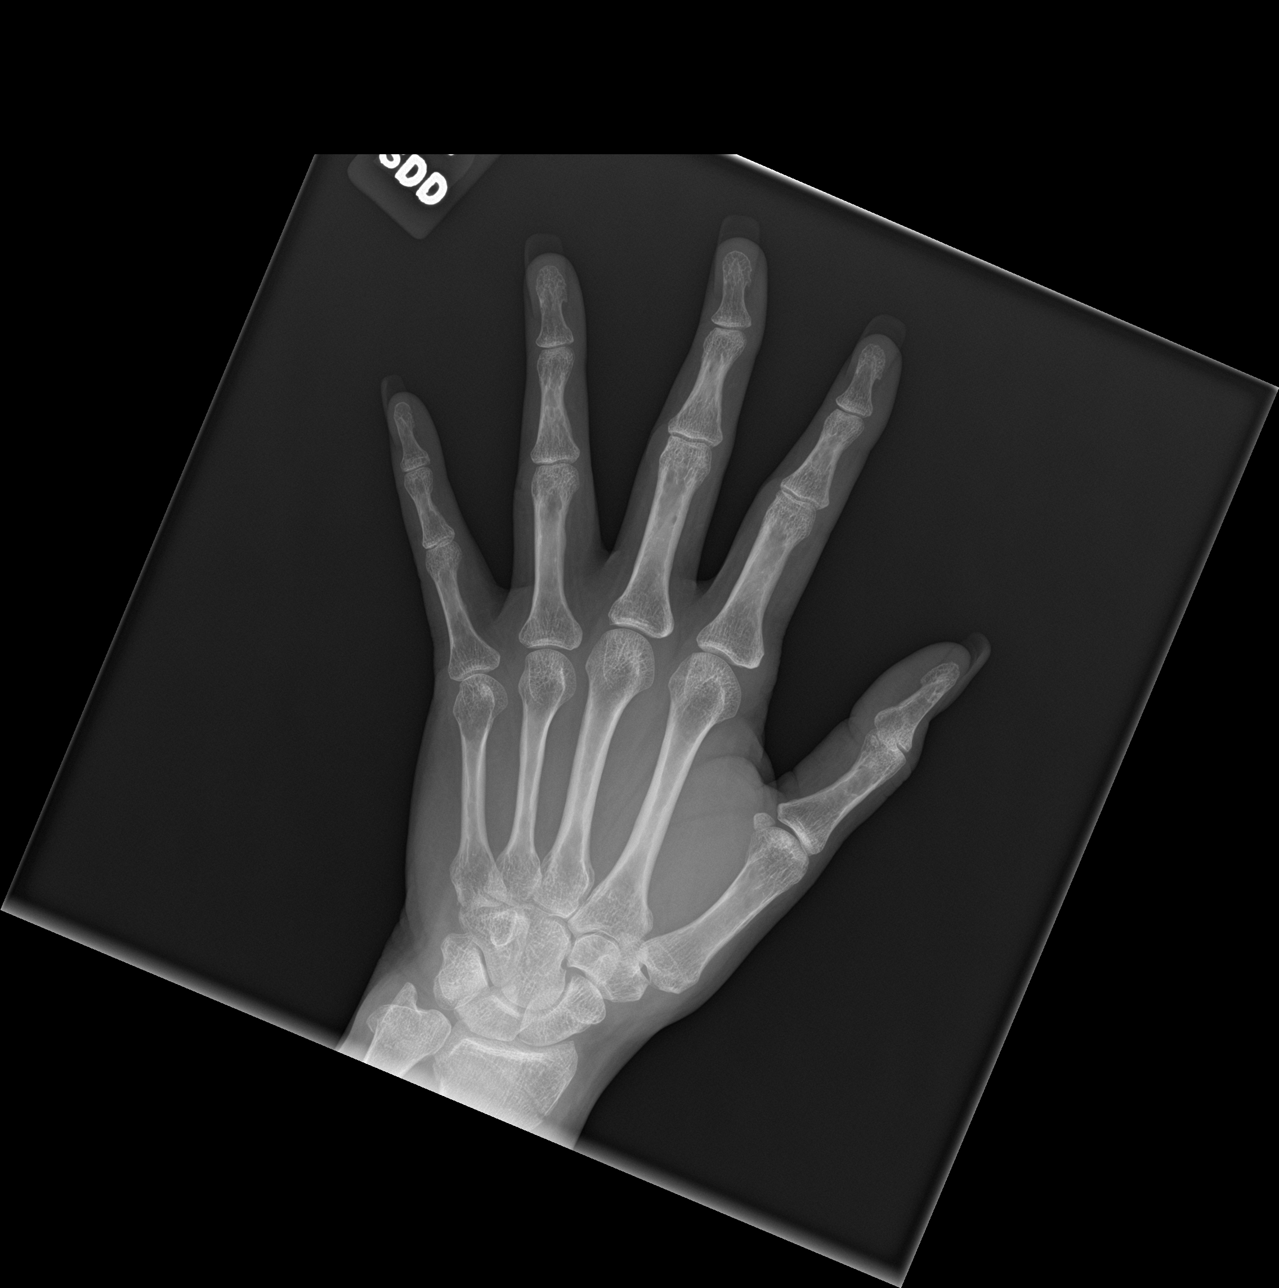

[hand obl]
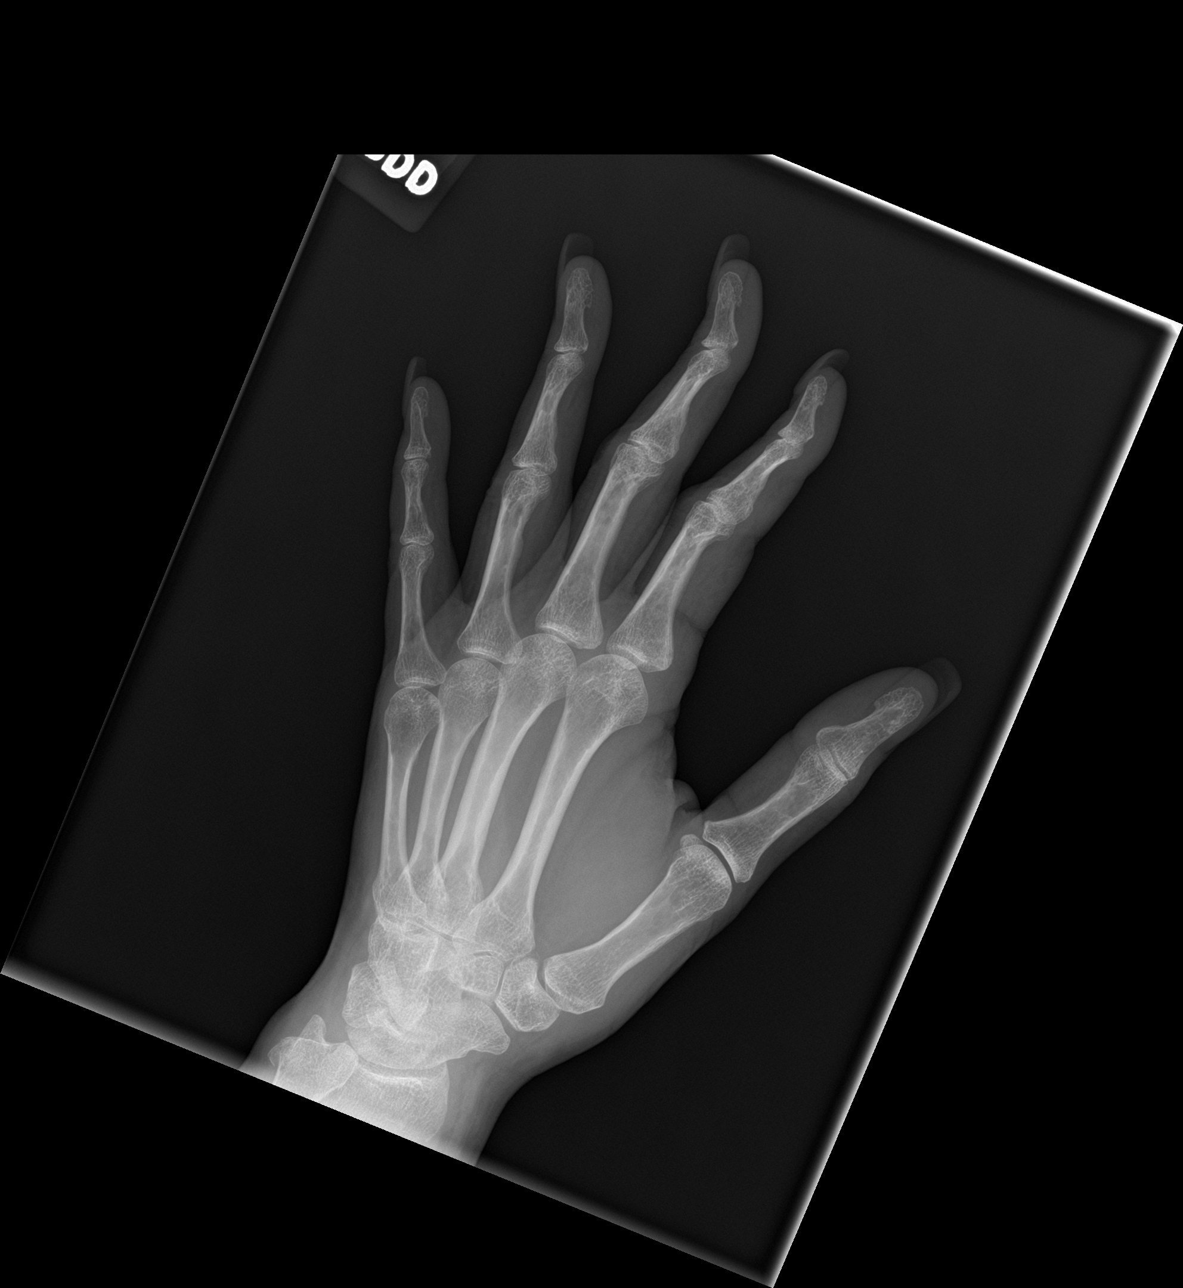

[hand lat]
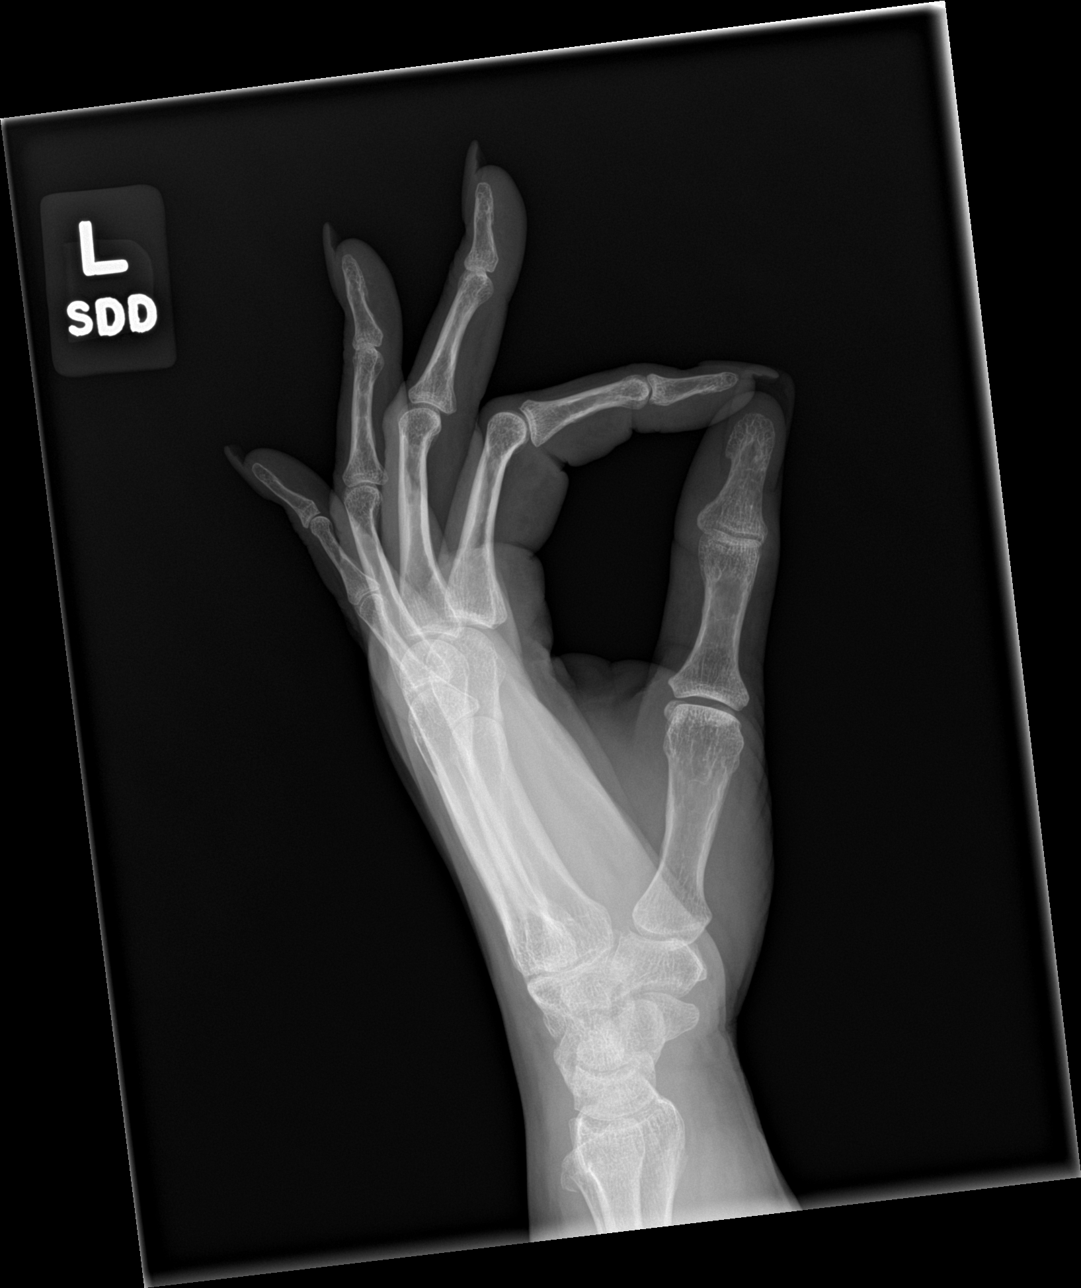

[3 of 3 positions shown; findings below may reference images not displayed]

FINDINGS: There is a nondisplaced fracture of the distal left radius. Carpal
bones appear intact and normally aligned. Osseous structures of the
left hand appear intact and normally aligned throughout.
IMPRESSION: 1. Nondisplaced fracture of the distal left radius.
2. No fracture or dislocation seen within the left hand.

## 2018-10-04 ENCOUNTER — Other Ambulatory Visit: Payer: Self-pay | Admitting: *Deleted

## 2018-10-04 DIAGNOSIS — Z20822 Contact with and (suspected) exposure to covid-19: Secondary | ICD-10-CM

## 2018-10-05 LAB — NOVEL CORONAVIRUS, NAA: SARS-CoV-2, NAA: NOT DETECTED

## 2019-05-15 ENCOUNTER — Ambulatory Visit: Payer: 59 | Attending: Internal Medicine

## 2019-05-15 DIAGNOSIS — Z23 Encounter for immunization: Secondary | ICD-10-CM

## 2019-05-15 NOTE — Progress Notes (Signed)
   Covid-19 Vaccination Clinic  Name:  Shelley Doyle    MRN: [id number] DOB: [birth year]  05/15/2019  [name] was observed post Covid-19 immunization for 15 minutes without incident. She was provided with Vaccine Information Sheet and instruction to access the V-Safe system.   [name] was instructed to call 911 with any severe reactions post vaccine: Marland Kitchen Difficulty breathing  . Swelling of face and throat  . A fast heartbeat  . A bad rash all over body  . Dizziness and weakness   Immunizations Administered    Name Date Dose VIS Date Route   Pfizer COVID-19 Vaccine 05/15/2019  8:29 AM 0.3 mL 01/17/2019 Intramuscular   Manufacturer: [employer], [employer]   Lot: [id number]   [place]: [id number]

## 2019-06-09 ENCOUNTER — Ambulatory Visit: Payer: 59 | Attending: Internal Medicine

## 2019-06-09 DIAGNOSIS — Z23 Encounter for immunization: Secondary | ICD-10-CM

## 2019-06-09 NOTE — Progress Notes (Signed)
   Covid-19 Vaccination Clinic  Name:  Shelley Doyle    MRN: [id number] DOB: 1965/12/25  06/09/2019  [name] was observed post Covid-19 immunization for 15 minutes without incident. She was provided with Vaccine Information Sheet and instruction to access the V-Safe system.   [name] was instructed to call 911 with any severe reactions post vaccine: Marland Kitchen Difficulty breathing  . Swelling of face and throat  . A fast heartbeat  . A bad rash all over body  . Dizziness and weakness   Immunizations Administered    Name Date Dose VIS Date Route   Pfizer COVID-19 Vaccine 06/09/2019  8:35 AM 0.3 mL 04/02/2018 Intramuscular   Manufacturer: [place]   Lot: [id number]   [place]: [id number]

## 2019-07-05 ENCOUNTER — Encounter (HOSPITAL_COMMUNITY): Payer: Self-pay

## 2019-07-05 ENCOUNTER — Emergency Department (HOSPITAL_COMMUNITY)
Admission: EM | Admit: 2019-07-05 | Discharge: 2019-07-05 | Disposition: A | Discharge status: Home / Self Care | Payer: PRIVATE HEALTH INSURANCE | Attending: Emergency Medicine | Admitting: Emergency Medicine

## 2019-07-05 ENCOUNTER — Emergency Department (HOSPITAL_COMMUNITY): Payer: PRIVATE HEALTH INSURANCE

## 2019-07-05 ENCOUNTER — Other Ambulatory Visit: Payer: Self-pay

## 2019-07-05 DIAGNOSIS — Z20822 Contact with and (suspected) exposure to covid-19: Secondary | ICD-10-CM | POA: Insufficient documentation

## 2019-07-05 DIAGNOSIS — R197 Diarrhea, unspecified: Secondary | ICD-10-CM

## 2019-07-05 DIAGNOSIS — B349 Viral infection, unspecified: Secondary | ICD-10-CM

## 2019-07-05 DIAGNOSIS — R112 Nausea with vomiting, unspecified: Secondary | ICD-10-CM | POA: Insufficient documentation

## 2019-07-05 DIAGNOSIS — Z87891 Personal history of nicotine dependence: Secondary | ICD-10-CM | POA: Insufficient documentation

## 2019-07-05 DIAGNOSIS — R05 Cough: Secondary | ICD-10-CM | POA: Insufficient documentation

## 2019-07-05 DIAGNOSIS — Z86018 Personal history of other benign neoplasm: Secondary | ICD-10-CM

## 2019-07-05 DIAGNOSIS — R109 Unspecified abdominal pain: Secondary | ICD-10-CM | POA: Insufficient documentation

## 2019-07-05 LAB — CBC WITH DIFFERENTIAL/PLATELET
Abs Immature Granulocytes: 0.02 10*3/uL (ref 0.00–0.07)
Basophils Absolute: 0 10*3/uL (ref 0.0–0.1)
Basophils Relative: 0 %
Eosinophils Absolute: 0.1 10*3/uL (ref 0.0–0.5)
Eosinophils Relative: 1 %
HCT: 38.3 % (ref 36.0–46.0)
Hemoglobin: 12.1 g/dL (ref 12.0–15.0)
Immature Granulocytes: 0 %
Lymphocytes Relative: 7 %
Lymphs Abs: 0.5 10*3/uL — ABNORMAL LOW (ref 0.7–4.0)
MCH: 28.1 pg (ref 26.0–34.0)
MCHC: 31.6 g/dL (ref 30.0–36.0)
MCV: 89.1 fL (ref 80.0–100.0)
Monocytes Absolute: 0.5 10*3/uL (ref 0.1–1.0)
Monocytes Relative: 6 %
Neutro Abs: 6.9 10*3/uL (ref 1.7–7.7)
Neutrophils Relative %: 86 %
Platelets: 191 10*3/uL (ref 150–400)
RBC: 4.3 MIL/uL (ref 3.87–5.11)
RDW: 15.1 % (ref 11.5–15.5)
WBC: 8 10*3/uL (ref 4.0–10.5)
nRBC: 0 % (ref 0.0–0.2)

## 2019-07-05 LAB — COMPREHENSIVE METABOLIC PANEL
ALT: 30 U/L (ref 0–44)
AST: 22 U/L (ref 15–41)
Albumin: 4.3 g/dL (ref 3.5–5.0)
Alkaline Phosphatase: 65 U/L (ref 38–126)
Anion gap: 7 (ref 5–15)
BUN: 12 mg/dL (ref 6–20)
CO2: 26 mmol/L (ref 22–32)
Calcium: 8.9 mg/dL (ref 8.9–10.3)
Chloride: 104 mmol/L (ref 98–111)
Creatinine, Ser: 1 mg/dL (ref 0.44–1.00)
GFR calc Af Amer: >60 mL/min (ref >60)
GFR calc non Af Amer: >60 mL/min (ref >60)
Glucose, Bld: 112 mg/dL — ABNORMAL HIGH (ref 70–99)
Potassium: 3.7 mmol/L (ref 3.5–5.1)
Sodium: 137 mmol/L (ref 135–145)
Total Bilirubin: 0.6 mg/dL (ref 0.3–1.2)
Total Protein: 7.1 g/dL (ref 6.5–8.1)

## 2019-07-05 LAB — URINALYSIS, ROUTINE W REFLEX MICROSCOPIC
Bacteria, UA: NONE SEEN
Bilirubin Urine: NEGATIVE
Glucose, UA: NEGATIVE mg/dL
Hgb urine dipstick: NEGATIVE
Ketones, ur: NEGATIVE mg/dL
Nitrite: NEGATIVE
Protein, ur: NEGATIVE mg/dL
Specific Gravity, Urine: 1.01 (ref 1.005–1.030)
pH: 6 (ref 5.0–8.0)

## 2019-07-05 LAB — SARS CORONAVIRUS 2 BY RT PCR (HOSPITAL ORDER, PERFORMED IN ~~LOC~~ HOSPITAL LAB): SARS Coronavirus 2: NEGATIVE

## 2019-07-05 LAB — GROUP A STREP BY PCR: Group A Strep by PCR: NOT DETECTED

## 2019-07-05 MED ORDER — KETOROLAC TROMETHAMINE 30 MG/ML IJ SOLN
15.0000 mg | Freq: Once | INTRAMUSCULAR | Status: AC
  Administered 2019-07-05: 15 mg via INTRAVENOUS
  Filled 2019-07-05: qty 1

## 2019-07-05 MED ORDER — SODIUM CHLORIDE 0.9 % IV BOLUS
1000.0000 mL | Freq: Once | INTRAVENOUS | Status: AC
  Administered 2019-07-05: 1000 mL via INTRAVENOUS

## 2019-07-05 MED ORDER — ONDANSETRON HCL 4 MG/2ML IJ SOLN
4.0000 mg | Freq: Once | INTRAMUSCULAR | Status: AC
  Administered 2019-07-05: 4 mg via INTRAVENOUS
  Filled 2019-07-05: qty 2

## 2019-07-05 MED ORDER — IOHEXOL 300 MG/ML  SOLN
100.0000 mL | Freq: Once | INTRAMUSCULAR | Status: AC | PRN
  Administered 2019-07-05: 100 mL via INTRAVENOUS

## 2019-07-05 NOTE — ED Notes (Signed)
Pt given ice water to sip on 

## 2019-07-05 NOTE — Discharge Instructions (Addendum)
Keep yourself hydrated.  Follow-up with your primary doctor.  Use Tylenol or Motrin as needed for aches and fevers.  Return to the ED with new or worsening symptoms.  As discussed, the CT of your abdomen and pelvis today shows a large uterine fibroid that has increased in size since 2015.  It is important that you call Dr. Johnnye Sima office to arrange follow-up appointment regarding this fibroid.

## 2019-07-05 NOTE — ED Provider Notes (Signed)
[facility] Provider Note   CSN: [id number] Arrival date & time: 07/05/19  0049     History Chief Complaint  Patient presents with  . Emesis    Shelley Doyle is a 54 y.o. female.  Patient with history of anemia presenting with a 2-day history of sore throat and a 1 day history of cough and posttussive emesis.  States her throat began feeling sore 2 days ago on both sides.  She has subjective chills but no documented fever.  Did receive both her vaccines about a month ago.  The next day she developed cough, runny nose and congestion.  Upon going to work today she had several episodes of posttussive emesis.  Denies any emesis other than with coughing.  She had a few episodes of diarrhea as well.  No documented fever.  No pain with urination or blood in the urine.  No chest pain or shortness of breath.  No abdominal pain.  No sick contacts or recent travel.  The history is provided by the patient.       Past Medical History:  Diagnosis Date  . Heavy menses   . History of blood transfusion   . Iron deficiency anemia   . Uterine fibroid 04/13/2015    Patient Active Problem List   Diagnosis Date Noted  . Closed fracture of left distal radius 12/03/16 01/18/2017  . Uterine fibroid 04/13/2015  . Symptomatic anemia 04/12/2015  . Near syncope 04/12/2015  . DUB (dysfunctional uterine bleeding) 04/12/2015  . AKI (acute kidney injury) ([place]) 04/12/2015    Past Surgical History:  Procedure Laterality Date  . TUBAL LIGATION       OB History   No obstetric history on file.     Family History  Problem Relation Age of Onset  . Diabetes Mother   . Heart failure Mother   . COPD Father   . Diabetes Sister   . Diabetes Brother     Social History   Tobacco Use  . Smoking status: Former Research scientist (life sciences)  . Smokeless tobacco: Never Used  Substance Use Topics  . Alcohol use: No  . Drug use: No    Home Medications Prior to Admission medications   Medication Sig  Start Date End Date Taking? Authorizing Provider  megestrol (MEGACE) 40 MG tablet Take one tablet 3 times daily for 5 days; then 1 tablet daily thereafter. 04/10/16   Jonnie Kind, MD  MELATONIN PO Take 1 tablet by mouth daily as needed (sleep).    [provider]    Allergies    Patient has no known allergies.  Review of Systems   Review of Systems  Constitutional: Positive for activity change, appetite change, chills, fatigue and fever.  HENT: Positive for congestion and rhinorrhea.   Eyes: Negative for visual disturbance.  Respiratory: Positive for cough. Negative for shortness of breath.   Cardiovascular: Negative for chest pain.  Gastrointestinal: Positive for diarrhea, nausea and vomiting. Negative for abdominal pain.  Genitourinary: Negative for dysuria and hematuria.  Musculoskeletal: Positive for arthralgias and myalgias.  Skin: Negative for rash.  Neurological: Positive for weakness. Negative for dizziness and headaches.   all other systems are negative except as noted in the HPI and PMH.    Physical Exam Updated Vital Signs BP 128/77 (BP Location: Left Arm)   Pulse (!) 107   Temp 99.4 F (37.4 C) (Oral)   Resp 17   Ht 5' 9.75" (1.772 m)   Wt 76.2 kg  LMP 06/22/2019   SpO2 98%   BMI 24.28 kg/m   Physical Exam Vitals and nursing note reviewed.  Constitutional:      General: She is not in acute distress.    Appearance: She is well-developed. She is not ill-appearing.     Comments: Feels warm  HENT:     Head: Normocephalic and atraumatic.     Mouth/Throat:     Pharynx: Posterior oropharyngeal erythema present. No oropharyngeal exudate.     Comments: Uvula midline, no asymmetry, no exudate Eyes:     Conjunctiva/sclera: Conjunctivae normal.     Pupils: Pupils are equal, round, and reactive to light.  Neck:     Comments: No meningismus. Cardiovascular:     Rate and Rhythm: Normal rate and regular rhythm.     Heart sounds: Normal heart sounds. No  murmur.  Pulmonary:     Effort: Pulmonary effort is normal. No respiratory distress.     Breath sounds: Normal breath sounds.  Abdominal:     Palpations: Abdomen is soft.     Tenderness: There is no abdominal tenderness. There is no guarding or rebound.  Musculoskeletal:        General: No tenderness. Normal range of motion.     Cervical back: Normal range of motion and neck supple.  Skin:    General: Skin is warm.     Capillary Refill: Capillary refill takes less than 2 seconds.  Neurological:     General: No focal deficit present.     Mental Status: She is alert and oriented to person, place, and time. Mental status is at baseline.     Cranial Nerves: No cranial nerve deficit.     Motor: No abnormal muscle tone.     Coordination: Coordination normal.     Comments: No ataxia on finger to nose bilaterally. No pronator drift. 5/5 strength throughout. CN 2-12 intact.Equal grip strength. Sensation intact.   Psychiatric:        Behavior: Behavior normal.     ED Results / Procedures / Treatments   Labs (all labs ordered are listed, but only abnormal results are displayed) Labs Reviewed  CBC WITH DIFFERENTIAL/PLATELET - Abnormal; Notable for the following components:      Result Value   Lymphs Abs 0.5 (*)    All other components within normal limits  COMPREHENSIVE METABOLIC PANEL - Abnormal; Notable for the following components:   Glucose, Bld 112 (*)    All other components within normal limits  URINALYSIS, ROUTINE W REFLEX MICROSCOPIC - Abnormal; Notable for the following components:   Leukocytes,Ua TRACE (*)    All other components within normal limits  SARS CORONAVIRUS 2 BY RT PCR (HOSPITAL ORDER, [place] [facility])  GROUP A STREP BY PCR    EKG None  Radiology DG Chest 2 View  Result Date: 07/05/2019 CLINICAL DATA:  Cough and fever EXAM: CHEST - 2 VIEW COMPARISON:  04/12/15 FINDINGS: The heart size and mediastinal contours are within normal limits.  Both lungs are clear. The visualized skeletal structures are unremarkable. IMPRESSION: No active cardiopulmonary disease. Electronically Signed   By: Ulyses Jarred M.D.   On: 07/05/2019 04:31    Procedures Procedures (including critical care time)  Medications Ordered in ED Medications - No data to display  ED Course  I have reviewed the triage vital signs and the nursing notes.  Pertinent labs & imaging results that were available during my care of the patient were reviewed by me and considered  in my medical decision making (see chart for details).    MDM Rules/Calculators/A&P                     Sore throat and congestion for 2 days, posttussive emesis today with diarrhea.  Patient appears well nontoxic.  She feels warm but is afebrile on arrival.  Her chest x-ray is negative for pneumonia.  Covid swab is negative rapid strep test is negative.  Labs are reassuring.  UA is benign.  Abdomen diffusely tender. Unclear source of patient's fever and presentation.  Could be viral syndrome.  Discussed possibility of Covid despite negative swab.  She still has ongoing abdominal pain but has not had any further diarrhea.  In the setting of fever and diffuse pain will obtain CT scan.  Otherwise work-up is reassuring.  If CT scan is reassuring we will treat supportively for viral syndrome.  Discussed with patient that Covid is still a possibility despite negative test that she should keep her self quarantined.  Care to be transferred at shift change.  Shelley Doyle was evaluated in Emergency Department on 07/05/2019 for the symptoms described in the history of present illness. She was evaluated in the context of the global COVID-19 pandemic, which necessitated consideration that the patient might be at risk for infection with the SARS-CoV-2 virus that causes COVID-19. Institutional protocols and algorithms that pertain to the evaluation of patients at risk for COVID-19 are in a state of rapid  change based on information released by regulatory bodies including the CDC and federal and state organizations. These policies and algorithms were followed during the patient's care in the ED.  Final Clinical Impression(s) / ED Diagnoses Final diagnoses:  Viral syndrome  Diarrhea of presumed infectious origin    Rx / DC Orders ED Discharge Orders    None       Blakelee Allington, Annie Main, MD 07/05/19 [id number]

## 2019-07-05 NOTE — ED Provider Notes (Signed)
Pt signed out to me by Dr. Charolotte Capuchin pending review of CT abdomen/pelvis  Patient is here with complaint of cough, congestion, sore throat and 3 episodes of vomiting.  She has had both her Covid vaccinations and her Covid test here is negative as well as her remaining laboratory studies.    On recheck, patient has been resting comfortably, no further vomiting or diarrhea during ER stay.  Chest x-ray was negative for acute findings, CT of the abdomen and pelvis was ordered.  Results reviewed by me shows an enlarged uterus with evidence of a massive dominant fibroid.  Uterus and fibroid have enlarged since prior study in 2015.  On further history, pt aware of the fibroid and she admits that she has not had gynecological follow-up in several years.  She denies having any pelvic pain but states that she does have intermittent episodes of vaginal bleeding and she has contributed this to being menopausal.  I have discussed these findings with the patient and I have strongly recommended that she follow back up with her OB/GYN, Dr. Glo Herring.  Patient verbalized understanding and agrees to the plan.  I feel that she is appropriate for discharge home and supportive tx.  DG Chest 2 View  Result Date: 07/05/2019 CLINICAL DATA:  Cough and fever EXAM: CHEST - 2 VIEW COMPARISON:  04/12/15 FINDINGS: The heart size and mediastinal contours are within normal limits. Both lungs are clear. The visualized skeletal structures are unremarkable. IMPRESSION: No active cardiopulmonary disease. Electronically Signed   By: Ulyses Jarred M.D.   On: 07/05/2019 04:31   CT ABDOMEN PELVIS W CONTRAST  Result Date: 07/05/2019 CLINICAL DATA:  54 year old with abdominal pain. Vomiting and coughing. EXAM: CT ABDOMEN AND PELVIS WITH CONTRAST TECHNIQUE: Multidetector CT imaging of the abdomen and pelvis was performed using the standard protocol following bolus administration of intravenous contrast. CONTRAST:  121mL OMNIPAQUE IOHEXOL  300 MG/ML  SOLN COMPARISON:  06/13/2013 FINDINGS: Lower chest: Lung bases are clear.  No pleural effusions. Hepatobiliary: Again noted are small hypodense structures in the liver. Index lesion in the right hepatic lobe on sequence 2, image 21 measures 1.1 cm and minimally changed in size. This small hypodense right hepatic lesion is also less conspicuous on the delayed images and may represent a small cavernous hemangioma. Probable cyst in left hepatic lobe on image 14 measures 1.1 cm, previously measured 0.6 cm. No suspicious liver lesions. No biliary dilatation. Gallbladder has a normal appearance. Portal venous system is patent. Pancreas: Unremarkable. No pancreatic ductal dilatation or surrounding inflammatory changes. Spleen: Normal in size without focal abnormality. Adrenals/Urinary Tract: Normal appearance of the adrenal glands. Normal appearance of the urinary bladder. Normal appearance of both kidneys without hydronephrosis. No suspicious renal lesion. Stomach/Bowel: Multiple diverticula involving the left colon. No evidence for acute colonic inflammation. Normal appendix. Normal appearance of the stomach and there are diverticula involving the distal duodenum. Vascular/Lymphatic: No significant vascular findings are present. No enlarged abdominal or pelvic lymph nodes. Reproductive: Uterus is markedly enlarged and measures 16.1 x 11.5 x 13.7 cm. There is a large intramural lesion suggestive for massive fibroid within the uterus. This fibroid measures up to 13.8 cm and previously measured roughly 10.5 cm. Uterus extends well into the abdomen and abuts the anterior abdominal wall at the level of the umbilicus. Evidence for a tampon in the vagina. No evidence for an adnexal or ovarian mass. Evidence for tubal ligation clips. Other: No ascites.  Negative for free air. Musculoskeletal: No acute  bone abnormality. IMPRESSION: 1. No acute inflammatory process in the abdomen or pelvis. Extensive diverticulosis  involving the left colon but no evidence for acute diverticulitis. 2. Enlarged uterus with evidence for a massive dominant fibroid. Uterus and fibroid have enlarged since 2015. 3. Small hypodense structures in the liver. These are likely incidental findings as described. Electronically Signed   By: Markus Daft M.D.   On: 07/05/2019 08:31      Kem Parkinson, PA-C 07/05/19 0902    Ezequiel Essex, MD 07/05/19 [phone]

## 2019-07-05 NOTE — ED Triage Notes (Signed)
Cough, congestion, sore throat, vomited x 3 tonight.  Pt has had both covid vacc.

## 2019-07-09 ENCOUNTER — Telehealth: Payer: Self-pay | Admitting: Obstetrics and Gynecology

## 2019-07-09 NOTE — Telephone Encounter (Signed)
Left message to pt in followup of ED visit, offering to see her in followup and reminding her of office number.

## 2019-07-16 ENCOUNTER — Telehealth: Payer: Self-pay | Admitting: Obstetrics & Gynecology

## 2019-07-16 NOTE — Telephone Encounter (Signed)

## 2019-07-17 ENCOUNTER — Ambulatory Visit (INDEPENDENT_AMBULATORY_CARE_PROVIDER_SITE_OTHER): Payer: Self-pay | Admitting: Obstetrics & Gynecology

## 2019-07-17 ENCOUNTER — Encounter: Payer: Self-pay | Admitting: Obstetrics & Gynecology

## 2019-07-17 VITALS — BP 141/91 | HR 95 | Ht 69.75 in | Wt 181.0 lb

## 2019-07-17 DIAGNOSIS — D259 Leiomyoma of uterus, unspecified: Secondary | ICD-10-CM

## 2019-07-17 NOTE — Progress Notes (Signed)
Patient ID: Shelley Doyle, female   DOB: March 13, 1965, 54 y.o.   MRN: [id number] Preoperative History and Physical  Shelley Doyle is a 54 y.o. No obstetric history on file. with Patient's last menstrual period was 06/22/2019. admitted for a TAH BSO due to enlarging fibroid uterus, 20 weeks size.  50% growth in 4 year time interval  PMH:    Past Medical History:  Diagnosis Date  . Heavy menses   . History of blood transfusion   . Iron deficiency anemia   . Uterine fibroid 04/13/2015    PSH:     Past Surgical History:  Procedure Laterality Date  . TUBAL LIGATION      POb/GynH:      OB History   No obstetric history on file.     SH:   Social History   Tobacco Use  . Smoking status: Former Research scientist (life sciences)  . Smokeless tobacco: Never Used  Vaping Use  . Vaping Use: Never used  Substance Use Topics  . Alcohol use: No  . Drug use: No    FH:    Family History  Problem Relation Age of Onset  . Diabetes Mother   . Heart failure Mother   . COPD Father   . Diabetes Sister   . Diabetes Brother      Allergies: No Known Allergies  Medications:       Current Outpatient Medications:  Marland Kitchen  MELATONIN PO, Take 1 tablet by mouth daily as needed (sleep)., Disp: , Rfl:   Review of Systems:   Review of Systems  Constitutional: Negative for fever, chills, weight loss, malaise/fatigue and diaphoresis.  HENT: Negative for hearing loss, ear pain, nosebleeds, congestion, sore throat, neck pain, tinnitus and ear discharge.   Eyes: Negative for blurred vision, double vision, photophobia, pain, discharge and redness.  Respiratory: Negative for cough, hemoptysis, sputum production, shortness of breath, wheezing and stridor.   Cardiovascular: Negative for chest pain, palpitations, orthopnea, claudication, leg swelling and PND.  Gastrointestinal: Positive for abdominal pain. Negative for heartburn, nausea, vomiting, diarrhea, constipation, blood in stool and melena.  Genitourinary: Negative for  dysuria, urgency, frequency, hematuria and flank pain.  Musculoskeletal: Negative for myalgias, back pain, joint pain and falls.  Skin: Negative for itching and rash.  Neurological: Negative for dizziness, tingling, tremors, sensory change, speech change, focal weakness, seizures, loss of consciousness, weakness and headaches.  Endo/Heme/Allergies: Negative for environmental allergies and polydipsia. Does not bruise/bleed easily.  Psychiatric/Behavioral: Negative for depression, suicidal ideas, hallucinations, memory loss and substance abuse. The patient is not nervous/anxious and does not have insomnia.      PHYSICAL EXAM:  Blood pressure (!) 141/91, pulse 95, height 5' 9.75" (1.772 m), weight 181 lb (82.1 kg), last menstrual period 06/22/2019.    Vitals reviewed. Constitutional: She is oriented to person, place, and time. She appears well-developed and well-nourished.  HENT:  Head: Normocephalic and atraumatic.  Right Ear: External ear normal.  Left Ear: External ear normal.  Nose: Nose normal.  Mouth/Throat: Oropharynx is clear and moist.  Eyes: Conjunctivae and EOM are normal. Pupils are equal, round, and reactive to light. Right eye exhibits no discharge. Left eye exhibits no discharge. No scleral icterus.  Neck: Normal range of motion. Neck supple. No tracheal deviation present. No thyromegaly present.  Cardiovascular: Normal rate, regular rhythm, normal heart sounds and intact distal pulses.  Exam reveals no gallop and no friction rub.   No murmur heard. Respiratory: Effort normal and breath sounds normal. No respiratory distress.  She has no wheezes. She has no rales. She exhibits no tenderness.  GI: Soft. Bowel sounds are normal. She exhibits no distension and no mass. There is tenderness. There is no rebound and no guarding.  Genitourinary:       Vulva is normal without lesions Vagina is pink moist without discharge Cervix normal in appearance and pap is normal Uterus is 20  weeks size fibroids palpable Adnexa is negative with normal sized ovaries by sonogram  Musculoskeletal: Normal range of motion. She exhibits no edema and no tenderness.  Neurological: She is alert and oriented to person, place, and time. She has normal reflexes. She displays normal reflexes. No cranial nerve deficit. She exhibits normal muscle tone. Coordination normal.  Skin: Skin is warm and dry. No rash noted. No erythema. No pallor.  Psychiatric: She has a normal mood and affect. Her behavior is normal. Judgment and thought content normal.    Labs: Results for orders placed or performed during the hospital encounter of 07/05/19 (from the past 336 hour(s))  CBC with Differential/Platelet   Collection Time: 07/05/19  2:13 AM  Result Value Ref Range   WBC 8.0 4.0 - 10.5 K/uL   RBC 4.30 3.87 - 5.11 MIL/uL   Hemoglobin 12.1 12.0 - 15.0 g/dL   HCT 38.3 36 - 46 %   MCV 89.1 80.0 - 100.0 fL   MCH 28.1 26.0 - 34.0 pg   MCHC 31.6 30.0 - 36.0 g/dL   RDW 15.1 11.5 - 15.5 %   Platelets 191 150 - 400 K/uL   nRBC 0.0 0.0 - 0.2 %   Neutrophils Relative % 86 %   Neutro Abs 6.9 1.7 - 7.7 K/uL   Lymphocytes Relative 7 %   Lymphs Abs 0.5 (L) 0.7 - 4.0 K/uL   Monocytes Relative 6 %   Monocytes Absolute 0.5 0 - 1 K/uL   Eosinophils Relative 1 %   Eosinophils Absolute 0.1 0 - 0 K/uL   Basophils Relative 0 %   Basophils Absolute 0.0 0 - 0 K/uL   Immature Granulocytes 0 %   Abs Immature Granulocytes 0.02 0.00 - 0.07 K/uL  Comprehensive metabolic panel   Collection Time: 07/05/19  2:13 AM  Result Value Ref Range   Sodium 137 135 - 145 mmol/L   Potassium 3.7 3.5 - 5.1 mmol/L   Chloride 104 98 - 111 mmol/L   CO2 26 22 - 32 mmol/L   Glucose, Bld 112 (H) 70 - 99 mg/dL   BUN 12 6 - 20 mg/dL   Creatinine, Ser 1.00 0.44 - 1.00 mg/dL   Calcium 8.9 8.9 - 10.3 mg/dL   Total Protein 7.1 6.5 - 8.1 g/dL   Albumin 4.3 3.5 - 5.0 g/dL   AST 22 15 - 41 U/L   ALT 30 0 - 44 U/L   Alkaline Phosphatase 65 38  - 126 U/L   Total Bilirubin 0.6 0.3 - 1.2 mg/dL   GFR calc non Af Amer >60 >60 mL/min   GFR calc Af Amer >60 >60 mL/min   Anion gap 7 5 - 15  SARS Coronavirus 2 by RT PCR (hospital order, performed in [facility] hospital lab) Nasopharyngeal Nasopharyngeal Swab   Collection Time: 07/05/19  2:25 AM   Specimen: Nasopharyngeal Swab  Result Value Ref Range   SARS Coronavirus 2 NEGATIVE NEGATIVE  Group A Strep by PCR   Collection Time: 07/05/19  3:42 AM   Specimen: Throat; Sterile Swab  Result Value Ref Range  Group A Strep by PCR NOT DETECTED NOT DETECTED  Urinalysis, Routine w reflex microscopic   Collection Time: 07/05/19  4:12 AM  Result Value Ref Range   Color, Urine YELLOW YELLOW   APPearance CLEAR CLEAR   Specific Gravity, Urine 1.010 1.005 - 1.030   pH 6.0 5.0 - 8.0   Glucose, UA NEGATIVE NEGATIVE mg/dL   Hgb urine dipstick NEGATIVE NEGATIVE   Bilirubin Urine NEGATIVE NEGATIVE   Ketones, ur NEGATIVE NEGATIVE mg/dL   Protein, ur NEGATIVE NEGATIVE mg/dL   Nitrite NEGATIVE NEGATIVE   Leukocytes,Ua TRACE (A) NEGATIVE   RBC / HPF 0-5 0 - 5 RBC/hpf   WBC, UA 0-5 0 - 5 WBC/hpf   Bacteria, UA NONE SEEN NONE SEEN   Squamous Epithelial / LPF 0-5 0 - 5    EKG: Orders placed or performed during the hospital encounter of 07/19/16  . ED EKG  . ED EKG  . EKG 12-Lead  . EKG 12-Lead  . EKG    Imaging Studies: DG Chest 2 View  Result Date: 07/05/2019 CLINICAL DATA:  Cough and fever EXAM: CHEST - 2 VIEW COMPARISON:  04/12/15 FINDINGS: The heart size and mediastinal contours are within normal limits. Both lungs are clear. The visualized skeletal structures are unremarkable. IMPRESSION: No active cardiopulmonary disease. Electronically Signed   By: Ulyses Jarred M.D.   On: 07/05/2019 04:31   CT ABDOMEN PELVIS W CONTRAST  Result Date: 07/05/2019 CLINICAL DATA:  54 year old with abdominal pain. Vomiting and coughing. EXAM: CT ABDOMEN AND PELVIS WITH CONTRAST TECHNIQUE: Multidetector CT  imaging of the abdomen and pelvis was performed using the standard protocol following bolus administration of intravenous contrast. CONTRAST:  132mL OMNIPAQUE IOHEXOL 300 MG/ML  SOLN COMPARISON:  06/13/2013 FINDINGS: Lower chest: Lung bases are clear.  No pleural effusions. Hepatobiliary: Again noted are small hypodense structures in the liver. Index lesion in the right hepatic lobe on sequence 2, image 21 measures 1.1 cm and minimally changed in size. This small hypodense right hepatic lesion is also less conspicuous on the delayed images and may represent a small cavernous hemangioma. Probable cyst in left hepatic lobe on image 14 measures 1.1 cm, previously measured 0.6 cm. No suspicious liver lesions. No biliary dilatation. Gallbladder has a normal appearance. Portal venous system is patent. Pancreas: Unremarkable. No pancreatic ductal dilatation or surrounding inflammatory changes. Spleen: Normal in size without focal abnormality. Adrenals/Urinary Tract: Normal appearance of the adrenal glands. Normal appearance of the urinary bladder. Normal appearance of both kidneys without hydronephrosis. No suspicious renal lesion. Stomach/Bowel: Multiple diverticula involving the left colon. No evidence for acute colonic inflammation. Normal appendix. Normal appearance of the stomach and there are diverticula involving the distal duodenum. Vascular/Lymphatic: No significant vascular findings are present. No enlarged abdominal or pelvic lymph nodes. Reproductive: Uterus is markedly enlarged and measures 16.1 x 11.5 x 13.7 cm. There is a large intramural lesion suggestive for massive fibroid within the uterus. This fibroid measures up to 13.8 cm and previously measured roughly 10.5 cm. Uterus extends well into the abdomen and abuts the anterior abdominal wall at the level of the umbilicus. Evidence for a tampon in the vagina. No evidence for an adnexal or ovarian mass. Evidence for tubal ligation clips. Other: No ascites.   Negative for free air. Musculoskeletal: No acute bone abnormality. IMPRESSION: 1. No acute inflammatory process in the abdomen or pelvis. Extensive diverticulosis involving the left colon but no evidence for acute diverticulitis. 2. Enlarged uterus with evidence for a massive  dominant fibroid. Uterus and fibroid have enlarged since 2015. 3. Small hypodense structures in the liver. These are likely incidental findings as described. Electronically Signed   By: Markus Daft M.D.   On: 07/05/2019 08:31      Assessment: Fibroid uterus, 20 weeks size, enlarging,  50% larger than her scan 4 years ago   Plan: TAH BSO 08/13/19  Shelley Doyle 07/17/2019 3:00 PM

## 2019-08-15 NOTE — Patient Instructions (Signed)
Shelley Doyle  08/15/2019     @PREFPERIOPPHARMACY @   Your procedure is scheduled on  08/20/2019  Report to [facility] at  [place].M.  Call this number if you have problems the morning of surgery:  [phone]   Remember:  Do not eat or drink after midnight.                      Take these medicines the morning of surgery with A SIP OF WATER  None    Do not wear jewelry, make-up or nail polish.  Do not wear lotions, powders, or perfumes. Please wear deodorant and brush your teeth.  Do not shave 48 hours prior to surgery.  Men may shave face and neck.  Do not bring valuables to the hospital.  [facility] is not responsible for any belongings or valuables.  Contacts, dentures or bridgework may not be worn into surgery.  Leave your suitcase in the car.  After surgery it may be brought to your room.  For patients admitted to the hospital, discharge time will be determined by your treatment team.  Patients discharged the day of surgery will not be allowed to drive home.   Name and phone number of your driver:   family Special instructions:  DO NOT smoke the morning of your procedure.  Please read over the following fact sheets that you were given. Pain Booklet, Coughing and Deep Breathing, Blood Transfusion Information, Surgical Site Infection Prevention, Anesthesia Post-op Instructions and Care and Recovery After Surgery       Abdominal Hysterectomy, Care After This sheet gives you information about how to care for yourself after your procedure. Your doctor may also give you more specific instructions. If you have problems or questions, contact your doctor. Follow these instructions at home: Bathing  Do not take baths, swim, or use a hot tub until your doctor says it is okay. Ask your doctor if you can take showers. You may only be allowed to take sponge baths for bathing.  Keep the bandage (dressing) dry until your doctor says it can be taken off. Surgical cut  (incision) care      Follow instructions from your doctor about how to take care of your cut from surgery. Make sure you: ? Wash your hands with soap and water before you change your bandage (dressing). If you cannot use soap and water, use hand sanitizer. ? Change your bandage as told by your doctor. ? Leave stitches (sutures), skin glue, or skin tape (adhesive) strips in place. They may need to stay in place for 2 weeks or longer. If tape strips get loose and curl up, you may trim the loose edges. Do not remove tape strips completely unless your doctor says it is okay.  Check your surgical cut area every day for signs of infection. Check for: ? Redness, swelling, or pain. ? Fluid or blood. ? Warmth. ? Pus or a bad smell. Activity  Do gentle, daily exercise as told by your doctor. You may be told to take short walks every day and go farther each time.  Do not lift anything that is heavier than 10 lb (4.5 kg), or the limit that your doctor tells you, until he or she says that it is safe.  Do not drive or use heavy machinery while taking prescription pain medicine.  Do not drive for 24 hours if you were given a medicine to  help you relax (sedative).  Follow your doctor's advice about exercise, driving, and general activities. Ask your doctor what activities are safe for you. Lifestyle   Do not douche, use tampons, or have sex for at least 6 weeks or as told by your doctor.  Do not drink alcohol until your doctor says it is okay.  Drink enough fluid to keep your pee (urine) clear or pale yellow.  Try to have someone at home with you for the first 1-2 weeks to help.  Do not use any products that contain nicotine or tobacco, such as cigarettes and e-cigarettes. These can slow down healing. If you need help quitting, ask your doctor. General instructions  Take over-the-counter and prescription medicines only as told by your doctor.  Do not take aspirin or ibuprofen. These  medicines can cause bleeding.  To prevent or treat constipation while you are taking prescription pain medicine, your doctor may suggest that you: ? Drink enough fluid to keep your urine clear or pale yellow. ? Take over-the-counter or prescription medicines. ? Eat foods that are high in fiber, such as:  Fresh fruits and vegetables.  Whole grains.  Beans. ? Limit foods that are high in fat and processed sugars, such as fried and sweet foods.  Keep all follow-up visits as told by your doctor. This is important. Contact a doctor if:  You have chills or fever.  You have redness, swelling, or pain around your cut.  You have fluid or blood coming from your cut.  Your cut feels warm to the touch.  You have pus or a bad smell coming from your cut.  Your cut breaks open.  You feel dizzy or light-headed.  You have pain or bleeding when you pee.  You keep having watery poop (diarrhea).  You keep feeling sick to your stomach (nauseous) or keep throwing up (vomiting).  You have unusual fluid (discharge) coming from your vagina.  You have a rash.  You have a reaction to your medicine.  Your pain medicine does not help. Get help right away if:  You have a fever and your symptoms get worse all of a sudden.  You have very bad belly (abdominal) pain.  You are short of breath.  You pass out (faint).  You have pain, swelling, or redness of your leg.  You bleed a lot from your vagina and notice clumps of blood (clots). Summary  Do not take baths, swim, or use a hot tub until your doctor says it is okay. Ask your doctor if you can take showers. You may only be allowed to take sponge baths for bathing.  Follow your doctor's advice about exercise, driving, and general activities. Ask your doctor what activities are safe for you.  Do not lift anything that is heavier than 10 lb (4.5 kg), or the limit that your doctor tells you, until he or she says that it is safe.  Try to  have someone at home with you for the first 1-2 weeks to help. This information is not intended to replace advice given to you by your health care provider. Make sure you discuss any questions you have with your health care provider. Document Revised: 03/28/2018 Document Reviewed: 01/12/2016 Elsevier Patient Education  2020 [place] Anesthesia, Adult, Care After This sheet gives you information about how to care for yourself after your procedure. Your health care provider may also give you more specific instructions. If you have problems or questions, contact your health  care provider. What can I expect after the procedure? After the procedure, the following side effects are common:  Pain or discomfort at the IV site.  Nausea.  Vomiting.  Sore throat.  Trouble concentrating.  Feeling cold or chills.  Weak or tired.  Sleepiness and fatigue.  Soreness and body aches. These side effects can affect parts of the body that were not involved in surgery. Follow these instructions at home:  For at least 24 hours after the procedure:  Have a responsible adult stay with you. It is important to have someone help care for you until you are awake and alert.  Rest as needed.  Do not: ? Participate in activities in which you could fall or become injured. ? Drive. ? Use heavy machinery. ? Drink alcohol. ? Take sleeping pills or medicines that cause drowsiness. ? Make important decisions or sign legal documents. ? Take care of children on your own. Eating and drinking  Follow any instructions from your health care provider about eating or drinking restrictions.  When you feel hungry, start by eating small amounts of foods that are soft and easy to digest (bland), such as toast. Gradually return to your regular diet.  Drink enough fluid to keep your urine pale yellow.  If you vomit, rehydrate by drinking water, juice, or clear broth. General instructions  If you have  sleep apnea, surgery and certain medicines can increase your risk for breathing problems. Follow instructions from your health care provider about wearing your sleep device: ? Anytime you are sleeping, including during daytime naps. ? While taking prescription pain medicines, sleeping medicines, or medicines that make you drowsy.  Return to your normal activities as told by your health care provider. Ask your health care provider what activities are safe for you.  Take over-the-counter and prescription medicines only as told by your health care provider.  If you smoke, do not smoke without supervision.  Keep all follow-up visits as told by your health care provider. This is important. Contact a health care provider if:  You have nausea or vomiting that does not get better with medicine.  You cannot eat or drink without vomiting.  You have pain that does not get better with medicine.  You are unable to pass urine.  You develop a skin rash.  You have a fever.  You have redness around your IV site that gets worse. Get help right away if:  You have difficulty breathing.  You have chest pain.  You have blood in your urine or stool, or you vomit blood. Summary  After the procedure, it is common to have a sore throat or nausea. It is also common to feel tired.  Have a responsible adult stay with you for the first 24 hours after general anesthesia. It is important to have someone help care for you until you are awake and alert.  When you feel hungry, start by eating small amounts of foods that are soft and easy to digest (bland), such as toast. Gradually return to your regular diet.  Drink enough fluid to keep your urine pale yellow.  Return to your normal activities as told by your health care provider. Ask your health care provider what activities are safe for you. This information is not intended to replace advice given to you by your health care provider. Make sure you discuss  any questions you have with your health care provider. Document Revised: 01/26/2017 Document Reviewed: 09/08/2016 Elsevier Patient Education  [place].  How to Use Chlorhexidine for Bathing Chlorhexidine gluconate (CHG) is a germ-killing (antiseptic) solution that is used to clean the skin. It can get rid of the bacteria that normally live on the skin and can keep them away for about 24 hours. To clean your skin with CHG, you may be given:  A CHG solution to use in the shower or as part of a sponge bath.  A prepackaged cloth that contains CHG. Cleaning your skin with CHG may help lower the risk for infection:  While you are staying in the intensive care unit of the hospital.  If you have a vascular access, such as a central line, to provide short-term or long-term access to your veins.  If you have a catheter to drain urine from your bladder.  If you are on a ventilator. A ventilator is a machine that helps you breathe by moving air in and out of your lungs.  After surgery. What are the risks? Risks of using CHG include:  A skin reaction.  Hearing loss, if CHG gets in your ears.  Eye injury, if CHG gets in your eyes and is not rinsed out.  The CHG product catching fire. Make sure that you avoid smoking and flames after applying CHG to your skin. Do not use CHG:  If you have a chlorhexidine allergy or have previously reacted to chlorhexidine.  On babies younger than 70 months of age. How to use CHG solution  Use CHG only as told by your health care provider, and follow the instructions on the label.  Use the full amount of CHG as directed. Usually, this is one bottle. During a shower Follow these steps when using CHG solution during a shower (unless your health care provider gives you different instructions): 1. Start the shower. 2. Use your normal soap and shampoo to wash your face and hair. 3. Turn off the shower or move out of the shower stream. 4. Pour the CHG  onto a clean washcloth. Do not use any type of brush or rough-edged sponge. 5. Starting at your neck, lather your body down to your toes. Make sure you follow these instructions: ? If you will be having surgery, pay special attention to the part of your body where you will be having surgery. Scrub this area for at least 1 minute. ? Do not use CHG on your head or face. If the solution gets into your ears or eyes, rinse them well with water. ? Avoid your genital area. ? Avoid any areas of skin that have broken skin, cuts, or scrapes. ? Scrub your back and under your arms. Make sure to wash skin folds. 6. Let the lather sit on your skin for 1-2 minutes or as long as told by your health care provider. 7. Thoroughly rinse your entire body in the shower. Make sure that all body creases and crevices are rinsed well. 8. Dry off with a clean towel. Do not put any substances on your body afterward--such as powder, lotion, or perfume--unless you are told to do so by your health care provider. Only use lotions that are recommended by the manufacturer. 9. Put on clean clothes or pajamas. 10. If it is the night before your surgery, sleep in clean sheets.  During a sponge bath Follow these steps when using CHG solution during a sponge bath (unless your health care provider gives you different instructions): 1. Use your normal soap and shampoo to wash your face and hair. 2. Pour the CHG onto  a clean washcloth. 3. Starting at your neck, lather your body down to your toes. Make sure you follow these instructions: ? If you will be having surgery, pay special attention to the part of your body where you will be having surgery. Scrub this area for at least 1 minute. ? Do not use CHG on your head or face. If the solution gets into your ears or eyes, rinse them well with water. ? Avoid your genital area. ? Avoid any areas of skin that have broken skin, cuts, or scrapes. ? Scrub your back and under your arms. Make  sure to wash skin folds. 4. Let the lather sit on your skin for 1-2 minutes or as long as told by your health care provider. 5. Using a different clean, wet washcloth, thoroughly rinse your entire body. Make sure that all body creases and crevices are rinsed well. 6. Dry off with a clean towel. Do not put any substances on your body afterward--such as powder, lotion, or perfume--unless you are told to do so by your health care provider. Only use lotions that are recommended by the manufacturer. 7. Put on clean clothes or pajamas. 8. If it is the night before your surgery, sleep in clean sheets. How to use CHG prepackaged cloths  Only use CHG cloths as told by your health care provider, and follow the instructions on the label.  Use the CHG cloth on clean, dry skin.  Do not use the CHG cloth on your head or face unless your health care provider tells you to.  When washing with the CHG cloth: ? Avoid your genital area. ? Avoid any areas of skin that have broken skin, cuts, or scrapes. Before surgery Follow these steps when using a CHG cloth to clean before surgery (unless your health care provider gives you different instructions): 1. Using the CHG cloth, vigorously scrub the part of your body where you will be having surgery. Scrub using a back-and-forth motion for 3 minutes. The area on your body should be completely wet with CHG when you are done scrubbing. 2. Do not rinse. Discard the cloth and let the area air-dry. Do not put any substances on the area afterward, such as powder, lotion, or perfume. 3. Put on clean clothes or pajamas. 4. If it is the night before your surgery, sleep in clean sheets.  For general bathing Follow these steps when using CHG cloths for general bathing (unless your health care provider gives you different instructions). 1. Use a separate CHG cloth for each area of your body. Make sure you wash between any folds of skin and between your fingers and toes. Wash  your body in the following order, switching to a new cloth after each step: ? The front of your neck, shoulders, and chest. ? Both of your arms, under your arms, and your hands. ? Your stomach and groin area, avoiding the genitals. ? Your right leg and foot. ? Your left leg and foot. ? The back of your neck, your back, and your buttocks. 2. Do not rinse. Discard the cloth and let the area air-dry. Do not put any substances on your body afterward--such as powder, lotion, or perfume--unless you are told to do so by your health care provider. Only use lotions that are recommended by the manufacturer. 3. Put on clean clothes or pajamas. Contact a health care provider if:  Your skin gets irritated after scrubbing.  You have questions about using your solution or cloth. Get help  right away if:  Your eyes become very red or swollen.  Your eyes itch badly.  Your skin itches badly and is red or swollen.  Your hearing changes.  You have trouble seeing.  You have swelling or tingling in your mouth or throat.  You have trouble breathing.  You swallow any chlorhexidine. Summary  Chlorhexidine gluconate (CHG) is a germ-killing (antiseptic) solution that is used to clean the skin. Cleaning your skin with CHG may help to lower your risk for infection.  You may be given CHG to use for bathing. It may be in a bottle or in a prepackaged cloth to use on your skin. Carefully follow your health care provider's instructions and the instructions on the product label.  Do not use CHG if you have a chlorhexidine allergy.  Contact your health care provider if your skin gets irritated after scrubbing. This information is not intended to replace advice given to you by your health care provider. Make sure you discuss any questions you have with your health care provider. Document Revised: 04/11/2018 Document Reviewed: 12/21/2016 Elsevier Patient Education  2020 [employer]  

## 2019-08-18 ENCOUNTER — Other Ambulatory Visit: Payer: Self-pay

## 2019-08-18 ENCOUNTER — Other Ambulatory Visit: Payer: Self-pay | Admitting: Obstetrics & Gynecology

## 2019-08-18 ENCOUNTER — Encounter (HOSPITAL_COMMUNITY)
Admission: RE | Admit: 2019-08-18 | Discharge: 2019-08-18 | Disposition: A | Discharge status: Home / Self Care | Payer: 59 | Source: Ambulatory Visit | Attending: Obstetrics & Gynecology | Admitting: Obstetrics & Gynecology

## 2019-08-18 ENCOUNTER — Other Ambulatory Visit (HOSPITAL_COMMUNITY)
Admission: RE | Admit: 2019-08-18 | Discharge: 2019-08-18 | Disposition: A | Discharge status: Home / Self Care | Payer: 59 | Source: Ambulatory Visit | Attending: Obstetrics & Gynecology | Admitting: Obstetrics & Gynecology

## 2019-08-18 ENCOUNTER — Encounter (HOSPITAL_COMMUNITY): Payer: Self-pay

## 2019-08-18 DIAGNOSIS — Z01812 Encounter for preprocedural laboratory examination: Secondary | ICD-10-CM | POA: Diagnosis not present

## 2019-08-18 DIAGNOSIS — Z20822 Contact with and (suspected) exposure to covid-19: Secondary | ICD-10-CM | POA: Diagnosis not present

## 2019-08-18 LAB — COMPREHENSIVE METABOLIC PANEL
ALT: 22 U/L (ref 0–44)
AST: 18 U/L (ref 15–41)
Albumin: 4.4 g/dL (ref 3.5–5.0)
Alkaline Phosphatase: 70 U/L (ref 38–126)
Anion gap: 8 (ref 5–15)
BUN: 13 mg/dL (ref 6–20)
CO2: 22 mmol/L (ref 22–32)
Calcium: 9.1 mg/dL (ref 8.9–10.3)
Chloride: 109 mmol/L (ref 98–111)
Creatinine, Ser: 0.84 mg/dL (ref 0.44–1.00)
GFR calc Af Amer: >60 mL/min (ref >60)
GFR calc non Af Amer: >60 mL/min (ref >60)
Glucose, Bld: 88 mg/dL (ref 70–99)
Potassium: 3.6 mmol/L (ref 3.5–5.1)
Sodium: 139 mmol/L (ref 135–145)
Total Bilirubin: 0.5 mg/dL (ref 0.3–1.2)
Total Protein: 7.3 g/dL (ref 6.5–8.1)

## 2019-08-18 LAB — CBC
HCT: 39.8 % (ref 36.0–46.0)
Hemoglobin: 12.5 g/dL (ref 12.0–15.0)
MCH: 27.8 pg (ref 26.0–34.0)
MCHC: 31.4 g/dL (ref 30.0–36.0)
MCV: 88.6 fL (ref 80.0–100.0)
Platelets: 236 10*3/uL (ref 150–400)
RBC: 4.49 MIL/uL (ref 3.87–5.11)
RDW: 15.5 % (ref 11.5–15.5)
WBC: 3.6 10*3/uL — ABNORMAL LOW (ref 4.0–10.5)
nRBC: 0 % (ref 0.0–0.2)

## 2019-08-18 LAB — TYPE AND SCREEN
ABO/RH(D): O POS
Antibody Screen: NEGATIVE

## 2019-08-18 LAB — URINALYSIS, ROUTINE W REFLEX MICROSCOPIC
Bilirubin Urine: NEGATIVE
Glucose, UA: NEGATIVE mg/dL
Hgb urine dipstick: NEGATIVE
Ketones, ur: NEGATIVE mg/dL
Leukocytes,Ua: NEGATIVE
Nitrite: NEGATIVE
Protein, ur: NEGATIVE mg/dL
Specific Gravity, Urine: 1.009 (ref 1.005–1.030)
pH: 6 (ref 5.0–8.0)

## 2019-08-18 LAB — RAPID HIV SCREEN (HIV 1/2 AB+AG)
HIV 1/2 Antibodies: NONREACTIVE
HIV-1 P24 Antigen - HIV24: NONREACTIVE

## 2019-08-18 LAB — HCG, QUANTITATIVE, PREGNANCY: hCG, Beta Chain, Quant, S: 5 m[IU]/mL — ABNORMAL HIGH (ref <5)

## 2019-08-19 LAB — SARS CORONAVIRUS 2 (TAT 6-24 HRS): SARS Coronavirus 2: NEGATIVE

## 2019-08-19 NOTE — Pre-Procedure Instructions (Signed)
Dr Elonda Husky aware of HCG results and no orders given. He wants to proceed with procedure due to her being menopausal, her results are elevated.

## 2019-08-20 ENCOUNTER — Other Ambulatory Visit: Payer: Self-pay

## 2019-08-20 ENCOUNTER — Encounter (HOSPITAL_COMMUNITY)
Admission: RE | Disposition: A | Discharge status: Home / Self Care | Payer: Self-pay | Source: Home / Self Care | Attending: Obstetrics & Gynecology

## 2019-08-20 ENCOUNTER — Inpatient Hospital Stay (HOSPITAL_COMMUNITY): Payer: 59 | Admitting: Anesthesiology

## 2019-08-20 ENCOUNTER — Ambulatory Visit (HOSPITAL_COMMUNITY)
Admission: RE | Admit: 2019-08-20 | Discharge: 2019-08-21 | Disposition: A | Discharge status: Home / Self Care | Payer: 59 | Attending: Obstetrics & Gynecology | Admitting: Obstetrics & Gynecology

## 2019-08-20 ENCOUNTER — Encounter (HOSPITAL_COMMUNITY): Payer: Self-pay | Admitting: Obstetrics & Gynecology

## 2019-08-20 DIAGNOSIS — N920 Excessive and frequent menstruation with regular cycle: Secondary | ICD-10-CM | POA: Insufficient documentation

## 2019-08-20 DIAGNOSIS — N800 Endometriosis of uterus: Secondary | ICD-10-CM | POA: Insufficient documentation

## 2019-08-20 DIAGNOSIS — D259 Leiomyoma of uterus, unspecified: Secondary | ICD-10-CM | POA: Insufficient documentation

## 2019-08-20 DIAGNOSIS — N72 Inflammatory disease of cervix uteri: Secondary | ICD-10-CM | POA: Insufficient documentation

## 2019-08-20 DIAGNOSIS — N838 Other noninflammatory disorders of ovary, fallopian tube and broad ligament: Secondary | ICD-10-CM | POA: Insufficient documentation

## 2019-08-20 DIAGNOSIS — Z9851 Tubal ligation status: Secondary | ICD-10-CM | POA: Insufficient documentation

## 2019-08-20 DIAGNOSIS — Z87891 Personal history of nicotine dependence: Secondary | ICD-10-CM | POA: Insufficient documentation

## 2019-08-20 DIAGNOSIS — Z90710 Acquired absence of both cervix and uterus: Secondary | ICD-10-CM | POA: Diagnosis present

## 2019-08-20 DIAGNOSIS — Z90722 Acquired absence of ovaries, bilateral: Secondary | ICD-10-CM

## 2019-08-20 HISTORY — PX: HYSTERECTOMY ABDOMINAL WITH SALPINGO-OOPHORECTOMY: SHX6792

## 2019-08-20 LAB — ABO/RH: ABO/RH(D): O POS

## 2019-08-20 SURGERY — HYSTERECTOMY, ABDOMINAL, WITH SALPINGO-OOPHORECTOMY
Anesthesia: General | Site: Abdomen

## 2019-08-20 MED ORDER — DOCUSATE SODIUM 100 MG PO CAPS
100.0000 mg | ORAL_CAPSULE | Freq: Two times a day (BID) | ORAL | Status: DC
  Administered 2019-08-20 – 2019-08-21 (×2): 100 mg via ORAL
  Filled 2019-08-20 (×2): qty 1

## 2019-08-20 MED ORDER — FENTANYL CITRATE (PF) 100 MCG/2ML IJ SOLN
50.0000 ug | INTRAMUSCULAR | Status: DC | PRN
  Administered 2019-08-20 (×2): 100 ug via INTRAVENOUS
  Filled 2019-08-20 (×2): qty 2

## 2019-08-20 MED ORDER — SODIUM CHLORIDE (PF) 0.9 % IJ SOLN
INTRAMUSCULAR | Status: AC
  Filled 2019-08-20: qty 20

## 2019-08-20 MED ORDER — ONDANSETRON HCL 4 MG/2ML IJ SOLN
INTRAMUSCULAR | Status: AC
  Filled 2019-08-20: qty 2

## 2019-08-20 MED ORDER — KETAMINE HCL 10 MG/ML IJ SOLN
INTRAMUSCULAR | Status: DC | PRN
Start: 2019-08-20 — End: 2019-08-20
  Administered 2019-08-20 (×2): 20 mg via INTRAVENOUS
  Administered 2019-08-20: 10 mg via INTRAVENOUS

## 2019-08-20 MED ORDER — LACTATED RINGERS IV SOLN: INTRAVENOUS | Status: DC

## 2019-08-20 MED ORDER — HYDROMORPHONE HCL 1 MG/ML IJ SOLN
0.2500 mg | INTRAMUSCULAR | Status: DC | PRN
  Administered 2019-08-20 (×4): 0.5 mg via INTRAVENOUS

## 2019-08-20 MED ORDER — ONDANSETRON HCL 4 MG/2ML IJ SOLN
INTRAMUSCULAR | Status: DC | PRN
  Administered 2019-08-20: 4 mg via INTRAVENOUS

## 2019-08-20 MED ORDER — HEMOSTATIC AGENTS (NO CHARGE) OPTIME
TOPICAL | Status: DC | PRN
  Administered 2019-08-20: 1 via TOPICAL

## 2019-08-20 MED ORDER — ALUM & MAG HYDROXIDE-SIMETH 200-200-20 MG/5ML PO SUSP: 30.0000 mL | ORAL | Status: DC | PRN

## 2019-08-20 MED ORDER — DEXAMETHASONE SODIUM PHOSPHATE 10 MG/ML IJ SOLN
INTRAMUSCULAR | Status: AC
  Filled 2019-08-20: qty 1

## 2019-08-20 MED ORDER — LIDOCAINE 2% (20 MG/ML) 5 ML SYRINGE
INTRAMUSCULAR | Status: AC
  Filled 2019-08-20: qty 5

## 2019-08-20 MED ORDER — LIDOCAINE HCL (CARDIAC) PF 100 MG/5ML IV SOSY
PREFILLED_SYRINGE | INTRAVENOUS | Status: DC | PRN
  Administered 2019-08-20: 60 mg via INTRAVENOUS

## 2019-08-20 MED ORDER — HYDROMORPHONE HCL 1 MG/ML IJ SOLN
INTRAMUSCULAR | Status: AC
  Filled 2019-08-20: qty 0.5

## 2019-08-20 MED ORDER — ZOLPIDEM TARTRATE 5 MG PO TABS: 10.0000 mg | ORAL_TABLET | Freq: Every evening | ORAL | Status: DC | PRN

## 2019-08-20 MED ORDER — PROPOFOL 10 MG/ML IV BOLUS
INTRAVENOUS | Status: DC | PRN
  Administered 2019-08-20: 200 mg via INTRAVENOUS

## 2019-08-20 MED ORDER — DEXAMETHASONE SODIUM PHOSPHATE 10 MG/ML IJ SOLN
INTRAMUSCULAR | Status: DC | PRN
  Administered 2019-08-20: 10 mg via INTRAVENOUS

## 2019-08-20 MED ORDER — BUPIVACAINE LIPOSOME 1.3 % IJ SUSP
20.0000 mL | Freq: Once | INTRAMUSCULAR | Status: DC
  Filled 2019-08-20: qty 20

## 2019-08-20 MED ORDER — DIPHENHYDRAMINE HCL 50 MG/ML IJ SOLN: 25.0000 mg | Freq: Four times a day (QID) | INTRAMUSCULAR | Status: DC | PRN

## 2019-08-20 MED ORDER — SENNOSIDES-DOCUSATE SODIUM 8.6-50 MG PO TABS: 1.0000 | ORAL_TABLET | Freq: Every evening | ORAL | Status: DC | PRN

## 2019-08-20 MED ORDER — FENTANYL CITRATE (PF) 100 MCG/2ML IJ SOLN
INTRAMUSCULAR | Status: DC | PRN
  Administered 2019-08-20 (×2): 100 ug via INTRAVENOUS
  Administered 2019-08-20: 50 ug via INTRAVENOUS

## 2019-08-20 MED ORDER — BUPIVACAINE LIPOSOME 1.3 % IJ SUSP
INTRAMUSCULAR | Status: AC
  Filled 2019-08-20: qty 20

## 2019-08-20 MED ORDER — KETOROLAC TROMETHAMINE 30 MG/ML IJ SOLN
30.0000 mg | Freq: Once | INTRAMUSCULAR | Status: AC
  Administered 2019-08-20: 30 mg via INTRAVENOUS
  Filled 2019-08-20: qty 1

## 2019-08-20 MED ORDER — SODIUM CHLORIDE 0.9 % IR SOLN
Status: DC | PRN
  Administered 2019-08-20: 2000 mL

## 2019-08-20 MED ORDER — PROMETHAZINE HCL 25 MG/ML IJ SOLN: 25.0000 mg | Freq: Four times a day (QID) | INTRAMUSCULAR | Status: DC | PRN

## 2019-08-20 MED ORDER — PROPOFOL 10 MG/ML IV BOLUS
INTRAVENOUS | Status: AC
  Filled 2019-08-20: qty 20

## 2019-08-20 MED ORDER — KETOROLAC TROMETHAMINE 30 MG/ML IJ SOLN
30.0000 mg | Freq: Four times a day (QID) | INTRAMUSCULAR | Status: AC
  Administered 2019-08-20 – 2019-08-21 (×4): 30 mg via INTRAVENOUS
  Filled 2019-08-20 (×4): qty 1

## 2019-08-20 MED ORDER — POVIDONE-IODINE 10 % EX SWAB: 2.0000 "application " | Freq: Once | CUTANEOUS | Status: DC

## 2019-08-20 MED ORDER — ONDANSETRON HCL 4 MG/2ML IJ SOLN: 4.0000 mg | Freq: Once | INTRAMUSCULAR | Status: DC | PRN

## 2019-08-20 MED ORDER — KETAMINE HCL 50 MG/5ML IJ SOSY
PREFILLED_SYRINGE | INTRAMUSCULAR | Status: AC
  Filled 2019-08-20: qty 5

## 2019-08-20 MED ORDER — ORAL CARE MOUTH RINSE: 15.0000 mL | Freq: Once | OROMUCOSAL | Status: AC

## 2019-08-20 MED ORDER — CHLORHEXIDINE GLUCONATE 0.12 % MT SOLN
15.0000 mL | Freq: Once | OROMUCOSAL | Status: AC
  Administered 2019-08-20: 15 mL via OROMUCOSAL

## 2019-08-20 MED ORDER — SUCCINYLCHOLINE CHLORIDE 200 MG/10ML IV SOSY
PREFILLED_SYRINGE | INTRAVENOUS | Status: AC
  Filled 2019-08-20: qty 10

## 2019-08-20 MED ORDER — ROCURONIUM BROMIDE 100 MG/10ML IV SOLN
INTRAVENOUS | Status: DC | PRN
  Administered 2019-08-20: 60 mg via INTRAVENOUS
  Administered 2019-08-20: 20 mg via INTRAVENOUS

## 2019-08-20 MED ORDER — CEFAZOLIN SODIUM-DEXTROSE 2-4 GM/100ML-% IV SOLN
2.0000 g | INTRAVENOUS | Status: AC
  Administered 2019-08-20: 2 g via INTRAVENOUS
  Filled 2019-08-20: qty 100

## 2019-08-20 MED ORDER — GABAPENTIN 100 MG PO CAPS
100.0000 mg | ORAL_CAPSULE | Freq: Three times a day (TID) | ORAL | Status: DC
  Administered 2019-08-20 – 2019-08-21 (×4): 100 mg via ORAL
  Filled 2019-08-20 (×4): qty 1

## 2019-08-20 MED ORDER — SODIUM CHLORIDE 0.9 % IV SOLN
INTRAVENOUS | Status: DC | PRN
  Administered 2019-08-20: 40 mL

## 2019-08-20 MED ORDER — FENTANYL CITRATE (PF) 250 MCG/5ML IJ SOLN
INTRAMUSCULAR | Status: AC
  Filled 2019-08-20: qty 5

## 2019-08-20 MED ORDER — MIDAZOLAM HCL 2 MG/2ML IJ SOLN
INTRAMUSCULAR | Status: AC
  Filled 2019-08-20: qty 2

## 2019-08-20 MED ORDER — ENOXAPARIN SODIUM 40 MG/0.4ML ~~LOC~~ SOLN
40.0000 mg | SUBCUTANEOUS | Status: DC
  Administered 2019-08-21: 40 mg via SUBCUTANEOUS
  Filled 2019-08-20: qty 0.4

## 2019-08-20 MED ORDER — IBUPROFEN 800 MG PO TABS
800.0000 mg | ORAL_TABLET | Freq: Four times a day (QID) | ORAL | Status: DC
  Administered 2019-08-21: 800 mg via ORAL
  Filled 2019-08-20: qty 1

## 2019-08-20 MED ORDER — SUGAMMADEX SODIUM 500 MG/5ML IV SOLN
INTRAVENOUS | Status: DC | PRN
  Administered 2019-08-20: 400 mg via INTRAVENOUS

## 2019-08-20 MED ORDER — OXYCODONE HCL 5 MG PO TABS
5.0000 mg | ORAL_TABLET | ORAL | Status: DC | PRN
  Administered 2019-08-20: 5 mg via ORAL
  Administered 2019-08-21 (×2): 10 mg via ORAL
  Filled 2019-08-20: qty 1
  Filled 2019-08-20 (×2): qty 2

## 2019-08-20 MED ORDER — SODIUM CHLORIDE 0.9 % IV SOLN
8.0000 mg | Freq: Four times a day (QID) | INTRAVENOUS | Status: DC | PRN
  Filled 2019-08-20: qty 4

## 2019-08-20 MED ORDER — KCL IN DEXTROSE-NACL 20-5-0.45 MEQ/L-%-% IV SOLN: INTRAVENOUS | Status: AC

## 2019-08-20 MED ORDER — ROCURONIUM BROMIDE 10 MG/ML (PF) SYRINGE
PREFILLED_SYRINGE | INTRAVENOUS | Status: AC
  Filled 2019-08-20: qty 10

## 2019-08-20 MED ORDER — BISACODYL 10 MG RE SUPP: 10.0000 mg | Freq: Every day | RECTAL | Status: DC | PRN

## 2019-08-20 MED ORDER — MIDAZOLAM HCL 5 MG/5ML IJ SOLN
INTRAMUSCULAR | Status: DC | PRN
  Administered 2019-08-20: 2 mg via INTRAVENOUS

## 2019-08-20 MED ORDER — PHENYLEPHRINE 40 MCG/ML (10ML) SYRINGE FOR IV PUSH (FOR BLOOD PRESSURE SUPPORT)
PREFILLED_SYRINGE | INTRAVENOUS | Status: AC
  Filled 2019-08-20: qty 10

## 2019-08-20 MED ORDER — ONDANSETRON HCL 4 MG PO TABS
8.0000 mg | ORAL_TABLET | Freq: Four times a day (QID) | ORAL | Status: DC | PRN
  Administered 2019-08-20: 8 mg via ORAL
  Filled 2019-08-20: qty 2

## 2019-08-20 MED ORDER — ZOLPIDEM TARTRATE 5 MG PO TABS: 5.0000 mg | ORAL_TABLET | Freq: Every evening | ORAL | Status: DC | PRN

## 2019-08-20 SURGICAL SUPPLY — 42 items
CLOTH BEACON ORANGE TIMEOUT ST (SAFETY) ×4 IMPLANT
COVER LIGHT HANDLE STERIS (MISCELLANEOUS) ×8 IMPLANT
COVER WAND RF STERILE (DRAPES) ×8 IMPLANT
DERMABOND ADVANCED (GAUZE/BANDAGES/DRESSINGS) ×1
DERMABOND ADVANCED .7 DNX12 (GAUZE/BANDAGES/DRESSINGS) ×3 IMPLANT
DRAPE WARM FLUID 44X44 (DRAPES) ×4 IMPLANT
DRSG OPSITE POSTOP 4X8 (GAUZE/BANDAGES/DRESSINGS) ×4 IMPLANT
ELECT REM PT RETURN 9FT ADLT (ELECTROSURGICAL) ×4
ELECTRODE REM PT RTRN 9FT ADLT (ELECTROSURGICAL) ×3 IMPLANT
GAUZE 4X4 16PLY RFD (DISPOSABLE) ×4 IMPLANT
GLOVE BIOGEL PI IND STRL 7.0 (GLOVE) ×9 IMPLANT
GLOVE BIOGEL PI IND STRL 8 (GLOVE) ×3 IMPLANT
GLOVE BIOGEL PI INDICATOR 7.0 (GLOVE) ×3
GLOVE BIOGEL PI INDICATOR 8 (GLOVE) ×1
GLOVE ECLIPSE 7.0 STRL STRAW (GLOVE) ×4 IMPLANT
GLOVE ECLIPSE 8.0 STRL XLNG CF (GLOVE) ×4 IMPLANT
GLOVE SURG SS PI 6.5 STRL IVOR (GLOVE) ×4 IMPLANT
GOWN STRL REUS W/TWL LRG LVL3 (GOWN DISPOSABLE) ×8 IMPLANT
GOWN STRL REUS W/TWL XL LVL3 (GOWN DISPOSABLE) ×4 IMPLANT
HEMOSTAT ARISTA ABSORB 3G PWDR (HEMOSTASIS) ×4 IMPLANT
INST SET MAJOR GENERAL (KITS) ×4 IMPLANT
KIT BLADEGUARD II DBL (SET/KITS/TRAYS/PACK) ×4 IMPLANT
KIT TURNOVER KIT A (KITS) ×4 IMPLANT
MANIFOLD NEPTUNE II (INSTRUMENTS) ×4 IMPLANT
NEEDLE HYPO 22GX1.5 SAFETY (NEEDLE) ×4 IMPLANT
NS IRRIG 1000ML POUR BTL (IV SOLUTION) ×8 IMPLANT
PACK MAJOR ABDOMINAL (CUSTOM PROCEDURE TRAY) ×4 IMPLANT
PAD ARMBOARD 7.5X6 YLW CONV (MISCELLANEOUS) ×4 IMPLANT
PENCIL SMOKE EVACUATOR (MISCELLANEOUS) ×4 IMPLANT
SET BASIN LINEN APH (SET/KITS/TRAYS/PACK) ×4 IMPLANT
SPONGE LAP 18X18 RF (DISPOSABLE) ×8 IMPLANT
SUT CHROMIC 0 CT 1 (SUTURE) ×4 IMPLANT
SUT MON AB 3-0 SH 27 (SUTURE) ×4 IMPLANT
SUT PLAIN 2 0 XLH (SUTURE) IMPLANT
SUT VIC AB 0 CT1 27 (SUTURE) ×16
SUT VIC AB 0 CT1 27XCR 8 STRN (SUTURE) ×12 IMPLANT
SUT VIC AB 0 CTX 36 (SUTURE) ×4
SUT VIC AB 0 CTX36XBRD ANTBCTR (SUTURE) ×3 IMPLANT
SUT VICRYL 3 0 (SUTURE) ×4 IMPLANT
SYR 20ML LL LF (SYRINGE) ×4 IMPLANT
TOWEL SURG RFD BLUE STRL DISP (DISPOSABLE) ×4 IMPLANT
TRAY FOLEY MTR SLVR 16FR STAT (SET/KITS/TRAYS/PACK) ×4 IMPLANT

## 2019-08-20 NOTE — Anesthesia Procedure Notes (Signed)
Procedure Name: Intubation Date/Time: 08/20/2019 10:34 AM Performed by: Jonna Munro, CRNA Pre-anesthesia Checklist: Patient identified, Emergency Drugs available, Suction available, Patient being monitored and Timeout performed Patient Re-evaluated:Patient Re-evaluated prior to induction Oxygen Delivery Method: Circle system utilized Preoxygenation: Pre-oxygenation with 100% oxygen Induction Type: IV induction Ventilation: Mask ventilation without difficulty Laryngoscope Size: Mac and 3 Grade View: Grade I Tube type: Oral Tube size: 7.0 mm Number of attempts: 1 Airway Equipment and Method: Stylet Placement Confirmation: ETT inserted through vocal cords under direct vision,  positive ETCO2 and breath sounds checked- equal and bilateral Secured at: 22 cm Tube secured with: Tape Dental Injury: Teeth and Oropharynx as per pre-operative assessment

## 2019-08-20 NOTE — H&P (Signed)
Preoperative History and Physical  Shelley Doyle is a 54 y.o. No obstetric history on file. with Patient's last menstrual period was 06/22/2019. admitted for a TAH BSO due to enlarging fibroid uterus, 20 weeks size.  50% growth in 4 year time interval  PMH:        Past Medical History:  Diagnosis Date  . Heavy menses   . History of blood transfusion   . Iron deficiency anemia   . Uterine fibroid 04/13/2015    PSH:          Past Surgical History:  Procedure Laterality Date  . TUBAL LIGATION      POb/GynH:      OB History   No obstetric history on file.     SH:   Social History       Tobacco Use  . Smoking status: Former Research scientist (life sciences)  . Smokeless tobacco: Never Used  Vaping Use  . Vaping Use: Never used  Substance Use Topics  . Alcohol use: No  . Drug use: No    FH:         Family History  Problem Relation Age of Onset  . Diabetes Mother   . Heart failure Mother   . COPD Father   . Diabetes Sister   . Diabetes Brother      Allergies: No Known Allergies  Medications:       Current Outpatient Medications:  Marland Kitchen  MELATONIN PO, Take 1 tablet by mouth daily as needed (sleep)., Disp: , Rfl:   Review of Systems:   Review of Systems  Constitutional: Negative for fever, chills, weight loss, malaise/fatigue and diaphoresis.  HENT: Negative for hearing loss, ear pain, nosebleeds, congestion, sore throat, neck pain, tinnitus and ear discharge.   Eyes: Negative for blurred vision, double vision, photophobia, pain, discharge and redness.  Respiratory: Negative for cough, hemoptysis, sputum production, shortness of breath, wheezing and stridor.   Cardiovascular: Negative for chest pain, palpitations, orthopnea, claudication, leg swelling and PND.  Gastrointestinal: Positive for abdominal pain. Negative for heartburn, nausea, vomiting, diarrhea, constipation, blood in stool and melena.  Genitourinary: Negative for dysuria, urgency, frequency,  hematuria and flank pain.  Musculoskeletal: Negative for myalgias, back pain, joint pain and falls.  Skin: Negative for itching and rash.  Neurological: Negative for dizziness, tingling, tremors, sensory change, speech change, focal weakness, seizures, loss of consciousness, weakness and headaches.  Endo/Heme/Allergies: Negative for environmental allergies and polydipsia. Does not bruise/bleed easily.  Psychiatric/Behavioral: Negative for depression, suicidal ideas, hallucinations, memory loss and substance abuse. The patient is not nervous/anxious and does not have insomnia.      PHYSICAL EXAM:  Blood pressure (!) 141/91, pulse 95, height 5' 9.75" (1.772 m), weight 181 lb (82.1 kg), last menstrual period 06/22/2019.    Vitals reviewed. Constitutional: She is oriented to person, place, and time. She appears well-developed and well-nourished.  HENT:  Head: Normocephalic and atraumatic.  Right Ear: External ear normal.  Left Ear: External ear normal.  Nose: Nose normal.  Mouth/Throat: Oropharynx is clear and moist.  Eyes: Conjunctivae and EOM are normal. Pupils are equal, round, and reactive to light. Right eye exhibits no discharge. Left eye exhibits no discharge. No scleral icterus.  Neck: Normal range of motion. Neck supple. No tracheal deviation present. No thyromegaly present.  Cardiovascular: Normal rate, regular rhythm, normal heart sounds and intact distal pulses.  Exam reveals no gallop and no friction rub.   No murmur heard. Respiratory: Effort normal and breath sounds normal. No  respiratory distress. She has no wheezes. She has no rales. She exhibits no tenderness.  GI: Soft. Bowel sounds are normal. She exhibits no distension and no mass. There is tenderness. There is no rebound and no guarding.  Genitourinary:       Vulva is normal without lesions Vagina is pink moist without discharge Cervix normal in appearance and pap is normal Uterus is 20 weeks size fibroids  palpable Adnexa is negative with normal sized ovaries by sonogram  Musculoskeletal: Normal range of motion. She exhibits no edema and no tenderness.  Neurological: She is alert and oriented to person, place, and time. She has normal reflexes. She displays normal reflexes. No cranial nerve deficit. She exhibits normal muscle tone. Coordination normal.  Skin: Skin is warm and dry. No rash noted. No erythema. No pallor.  Psychiatric: She has a normal mood and affect. Her behavior is normal. Judgment and thought content normal.    Labs:  Results for orders placed or performed during the hospital encounter of 08/18/19 (from the past 72 hour(s))  SARS CORONAVIRUS 2 (TAT 6-24 HRS) Nasopharyngeal Nasopharyngeal Swab     Status: None   Collection Time: 08/18/19  3:17 PM   Specimen: Nasopharyngeal Swab  Result Value Ref Range   SARS Coronavirus 2 NEGATIVE NEGATIVE    Comment: (NOTE) SARS-CoV-2 target nucleic acids are NOT DETECTED.  The SARS-CoV-2 RNA is generally detectable in upper and lower respiratory specimens during the acute phase of infection. Negative results do not preclude SARS-CoV-2 infection, do not rule out co-infections with other pathogens, and should not be used as the sole basis for treatment or other patient management decisions. Negative results must be combined with clinical observations, patient history, and epidemiological information. The expected result is Negative.  Fact Sheet for Patients: SugarRoll.be  Fact Sheet for Healthcare Providers: https://www.woods-mathews.com/  This test is not yet approved or cleared by the [place] FDA and  has been authorized for detection and/or diagnosis of SARS-CoV-2 by FDA under an Emergency Use Authorization (EUA). This EUA will remain  in effect (meaning this test can be used) for the duration of the COVID-19 declaration under Se ction 564(b)(1) of the Act, 21 U.S.C. section  360bbb-3(b)(1), unless the authorization is terminated or revoked sooner.  Performed at [facility], [place] [address] [place] [address]   CBC     Status: Abnormal   Collection Time: 08/18/19  3:33 PM  Result Value Ref Range   WBC 3.6 (L) 4.0 - 10.5 K/uL   RBC 4.49 3.87 - 5.11 MIL/uL   Hemoglobin 12.5 12.0 - 15.0 g/dL   HCT 39.8 36 - 46 %   MCV 88.6 80.0 - 100.0 fL   MCH 27.8 26.0 - 34.0 pg   MCHC 31.4 30.0 - 36.0 g/dL   RDW 15.5 11.5 - 15.5 %   Platelets 236 150 - 400 K/uL   nRBC 0.0 0.0 - 0.2 %    Comment: Performed at [facility], [address] [place], [place] [address]  Comprehensive metabolic panel     Status: None   Collection Time: 08/18/19  3:33 PM  Result Value Ref Range   Sodium 139 135 - 145 mmol/L   Potassium 3.6 3.5 - 5.1 mmol/L   Chloride 109 98 - 111 mmol/L   CO2 22 22 - 32 mmol/L   Glucose, Bld 88 70 - 99 mg/dL    Comment: Glucose reference range applies only to samples taken after fasting for at least  8 hours.   BUN 13 6 - 20 mg/dL   Creatinine, Ser 0.84 0.44 - 1.00 mg/dL   Calcium 9.1 8.9 - 10.3 mg/dL   Total Protein 7.3 6.5 - 8.1 g/dL   Albumin 4.4 3.5 - 5.0 g/dL   AST 18 15 - 41 U/L   ALT 22 0 - 44 U/L   Alkaline Phosphatase 70 38 - 126 U/L   Total Bilirubin 0.5 0.3 - 1.2 mg/dL   GFR calc non Af Amer >60 >60 mL/min   GFR calc Af Amer >60 >60 mL/min   Anion gap 8 5 - 15    Comment: Performed at [facility], [address], [place], [place] [address]  hCG, quantitative, pregnancy     Status: Abnormal   Collection Time: 08/18/19  3:33 PM  Result Value Ref Range   hCG, Beta Chain, Quant, S 5 (H) <5 mIU/mL    Comment:          GEST. AGE      CONC.  (mIU/mL)   <=1 WEEK        5 - 50     2 WEEKS       50 - 500     3 WEEKS       100 - 10,000     4 WEEKS     1,000 - 30,000     5 WEEKS     3,500 - 115,000   6-8 WEEKS     12,000 - 270,000    12 WEEKS     15,000 - 220,000        FEMALE AND NON-PREGNANT FEMALE:     LESS THAN 5  mIU/mL Performed at [facility], [address] [place], [place] [address]   Rapid HIV screen (HIV 1/2 Ab+Ag)     Status: None   Collection Time: 08/18/19  3:33 PM  Result Value Ref Range   HIV-1 P24 Antigen - HIV24 NON REACTIVE NON REACTIVE    Comment: (NOTE) Detection of p24 may be inhibited by biotin in the sample, causing false negative results in acute infection.    HIV 1/2 Antibodies NON REACTIVE NON REACTIVE   Interpretation (HIV Ag Ab)      A non reactive test result means that HIV 1 or HIV 2 antibodies and HIV 1 p24 antigen were not detected in the specimen.    Comment: Performed at [facility], [address], [place], [place] [address]  Type and screen     Status: None   Collection Time: 08/18/19  3:33 PM  Result Value Ref Range   ABO/RH(D) O POS    Antibody Screen NEG    Sample Expiration      08/21/2019,2359 Performed at [facility], [address], [place], [place] [address]   Urinalysis, Routine w reflex microscopic     Status: Abnormal   Collection Time: 08/18/19  4:00 PM  Result Value Ref Range   Color, Urine STRAW (A) YELLOW   APPearance CLEAR CLEAR   Specific Gravity, Urine 1.009 1.005 - 1.030   pH 6.0 5.0 - 8.0   Glucose, UA NEGATIVE NEGATIVE mg/dL   Hgb urine dipstick NEGATIVE NEGATIVE   Bilirubin Urine NEGATIVE NEGATIVE   Ketones, ur NEGATIVE NEGATIVE mg/dL   Protein, ur NEGATIVE NEGATIVE mg/dL   Nitrite NEGATIVE NEGATIVE   Leukocytes,Ua NEGATIVE NEGATIVE    Comment: Performed at [facility], [address] [place],  [address]        Results for  orders placed or performed during the hospital encounter of 07/05/19 (from the past 336 hour(s))  CBC with Differential/Platelet   Collection Time: 07/05/19  2:13 AM  Result Value Ref Range   WBC 8.0 4.0 - 10.5 K/uL   RBC 4.30 3.87 - 5.11 MIL/uL   Hemoglobin 12.1 12.0 - 15.0 g/dL   HCT 38.3 36 - 46 %   MCV 89.1 80.0 - 100.0 fL   MCH 28.1 26.0 - 34.0 pg   MCHC 31.6 30.0 - 36.0 g/dL    RDW 15.1 11.5 - 15.5 %   Platelets 191 150 - 400 K/uL   nRBC 0.0 0.0 - 0.2 %   Neutrophils Relative % 86 %   Neutro Abs 6.9 1.7 - 7.7 K/uL   Lymphocytes Relative 7 %   Lymphs Abs 0.5 (L) 0.7 - 4.0 K/uL   Monocytes Relative 6 %   Monocytes Absolute 0.5 0 - 1 K/uL   Eosinophils Relative 1 %   Eosinophils Absolute 0.1 0 - 0 K/uL   Basophils Relative 0 %   Basophils Absolute 0.0 0 - 0 K/uL   Immature Granulocytes 0 %   Abs Immature Granulocytes 0.02 0.00 - 0.07 K/uL  Comprehensive metabolic panel   Collection Time: 07/05/19  2:13 AM  Result Value Ref Range   Sodium 137 135 - 145 mmol/L   Potassium 3.7 3.5 - 5.1 mmol/L   Chloride 104 98 - 111 mmol/L   CO2 26 22 - 32 mmol/L   Glucose, Bld 112 (H) 70 - 99 mg/dL   BUN 12 6 - 20 mg/dL   Creatinine, Ser 1.00 0.44 - 1.00 mg/dL   Calcium 8.9 8.9 - 10.3 mg/dL   Total Protein 7.1 6.5 - 8.1 g/dL   Albumin 4.3 3.5 - 5.0 g/dL   AST 22 15 - 41 U/L   ALT 30 0 - 44 U/L   Alkaline Phosphatase 65 38 - 126 U/L   Total Bilirubin 0.6 0.3 - 1.2 mg/dL   GFR calc non Af Amer >60 >60 mL/min   GFR calc Af Amer >60 >60 mL/min   Anion gap 7 5 - 15  SARS Coronavirus 2 by RT PCR (hospital order, performed in [facility] lab) Nasopharyngeal Nasopharyngeal Swab   Collection Time: 07/05/19  2:25 AM   Specimen: Nasopharyngeal Swab  Result Value Ref Range   SARS Coronavirus 2 NEGATIVE NEGATIVE  Group A Strep by PCR   Collection Time: 07/05/19  3:42 AM   Specimen: Throat; Sterile Swab  Result Value Ref Range   Group A Strep by PCR NOT DETECTED NOT DETECTED  Urinalysis, Routine w reflex microscopic   Collection Time: 07/05/19  4:12 AM  Result Value Ref Range   Color, Urine YELLOW YELLOW   APPearance CLEAR CLEAR   Specific Gravity, Urine 1.010 1.005 - 1.030   pH 6.0 5.0 - 8.0   Glucose, UA NEGATIVE NEGATIVE mg/dL   Hgb urine dipstick NEGATIVE NEGATIVE   Bilirubin Urine NEGATIVE NEGATIVE   Ketones,  ur NEGATIVE NEGATIVE mg/dL   Protein, ur NEGATIVE NEGATIVE mg/dL   Nitrite NEGATIVE NEGATIVE   Leukocytes,Ua TRACE (A) NEGATIVE   RBC / HPF 0-5 0 - 5 RBC/hpf   WBC, UA 0-5 0 - 5 WBC/hpf   Bacteria, UA NONE SEEN NONE SEEN   Squamous Epithelial / LPF 0-5 0 - 5    EKG:      Imaging Studies:  Imaging Results  DG Chest 2 View  Result Date: 07/05/2019 CLINICAL  DATA:  Cough and fever EXAM: CHEST - 2 VIEW COMPARISON:  04/12/15 FINDINGS: The heart size and mediastinal contours are within normal limits. Both lungs are clear. The visualized skeletal structures are unremarkable. IMPRESSION: No active cardiopulmonary disease. Electronically Signed   By: Ulyses Jarred M.D.   On: 07/05/2019 04:31   CT ABDOMEN PELVIS W CONTRAST  Result Date: 07/05/2019 CLINICAL DATA:  54 year old with abdominal pain. Vomiting and coughing. EXAM: CT ABDOMEN AND PELVIS WITH CONTRAST TECHNIQUE: Multidetector CT imaging of the abdomen and pelvis was performed using the standard protocol following bolus administration of intravenous contrast. CONTRAST:  130mL OMNIPAQUE IOHEXOL 300 MG/ML  SOLN COMPARISON:  06/13/2013 FINDINGS: Lower chest: Lung bases are clear.  No pleural effusions. Hepatobiliary: Again noted are small hypodense structures in the liver. Index lesion in the right hepatic lobe on sequence 2, image 21 measures 1.1 cm and minimally changed in size. This small hypodense right hepatic lesion is also less conspicuous on the delayed images and may represent a small cavernous hemangioma. Probable cyst in left hepatic lobe on image 14 measures 1.1 cm, previously measured 0.6 cm. No suspicious liver lesions. No biliary dilatation. Gallbladder has a normal appearance. Portal venous system is patent. Pancreas: Unremarkable. No pancreatic ductal dilatation or surrounding inflammatory changes. Spleen: Normal in size without focal abnormality. Adrenals/Urinary Tract: Normal appearance of the adrenal glands. Normal  appearance of the urinary bladder. Normal appearance of both kidneys without hydronephrosis. No suspicious renal lesion. Stomach/Bowel: Multiple diverticula involving the left colon. No evidence for acute colonic inflammation. Normal appendix. Normal appearance of the stomach and there are diverticula involving the distal duodenum. Vascular/Lymphatic: No significant vascular findings are present. No enlarged abdominal or pelvic lymph nodes. Reproductive: Uterus is markedly enlarged and measures 16.1 x 11.5 x 13.7 cm. There is a large intramural lesion suggestive for massive fibroid within the uterus. This fibroid measures up to 13.8 cm and previously measured roughly 10.5 cm. Uterus extends well into the abdomen and abuts the anterior abdominal wall at the level of the umbilicus. Evidence for a tampon in the vagina. No evidence for an adnexal or ovarian mass. Evidence for tubal ligation clips. Other: No ascites.  Negative for free air. Musculoskeletal: No acute bone abnormality. IMPRESSION: 1. No acute inflammatory process in the abdomen or pelvis. Extensive diverticulosis involving the left colon but no evidence for acute diverticulitis. 2. Enlarged uterus with evidence for a massive dominant fibroid. Uterus and fibroid have enlarged since 2015. 3. Small hypodense structures in the liver. These are likely incidental findings as described. Electronically Signed   By: Markus Daft M.D.   On: 07/05/2019 08:31       Assessment: Fibroid uterus, 20 weeks size, enlarging,  50% larger than her scan 4 years ago   Plan: TAH BSO 08/20/19  Mertie Clause Hani Patnode 07/17/2019 3:00 PM

## 2019-08-20 NOTE — Anesthesia Postprocedure Evaluation (Signed)
Anesthesia Post Note  Patient: Shelley Doyle  Procedure(s) Performed: TOTAL ABDOMINAL HYSTERECTOMY WITH SALPINGO OOPHORECTOMY (N/A Abdomen)  Patient location during evaluation: PACU Anesthesia Type: General Level of consciousness: awake Pain management: pain level controlled Vital Signs Assessment: post-procedure vital signs reviewed and stable Respiratory status: spontaneous breathing Cardiovascular status: blood pressure returned to baseline Postop Assessment: no apparent nausea or vomiting Anesthetic complications: no   No complications documented.   Last Vitals:  Vitals:   08/20/19 1300 08/20/19 1315  BP: 132/80 120/87  Pulse: 86 77  Resp: 14 13  Temp:    SpO2: 99% 98%    Last Pain:  Vitals:   08/20/19 1300  TempSrc:   PainSc: [place]

## 2019-08-20 NOTE — Transfer of Care (Signed)
Immediate Anesthesia Transfer of Care Note  Patient: [name]  Procedure(s) Performed: TOTAL ABDOMINAL HYSTERECTOMY WITH SALPINGO OOPHORECTOMY (N/A Abdomen)  Patient Location: PACU  Anesthesia Type:General  Level of Consciousness: awake, drowsy and patient cooperative  Airway & Oxygen Therapy: Patient Spontanous Breathing and Patient connected to nasal cannula oxygen  Post-op Assessment: Report given to RN, Post -op Vital signs reviewed and stable and Patient moving all extremities X 4  Post vital signs: Reviewed and stable  Last Vitals:  Vitals Value Taken Time  BP 124/79 08/20/19 1231  Temp    Pulse 87 08/20/19 1231  Resp 15 08/20/19 1231  SpO2 96 % 08/20/19 1231  Vitals shown include unvalidated device data.  Last Pain:  Vitals:   08/20/19 0856  TempSrc: (P) Oral  PainSc: (P) 0-No pain      Patients Stated Pain Goal: (P) 8 (35/36/14 4315)  Complications: No complications documented.

## 2019-08-20 NOTE — Interval H&P Note (Signed)
History and Physical Interval Note:  08/20/2019 10:04 AM  Shelley Doyle  has presented today for surgery, with the diagnosis of 20 Week Fibroid Uterus.  The various methods of treatment have been discussed with the patient and family. After consideration of risks, benefits and other options for treatment, the patient has consented to  Procedure(s): HYSTERECTOMY ABDOMINAL (N/A) OPEN SALPINGO OOPHORECTOMY (Bilateral) as a surgical intervention.  The patient's history has been reviewed, patient examined, no change in status, stable for surgery.  I have reviewed the patient's chart and labs.  Questions were answered to the patient's satisfaction.     Florian Buff

## 2019-08-20 NOTE — Anesthesia Preprocedure Evaluation (Signed)
Anesthesia Evaluation  Patient identified by MRN, date of birth, ID band Patient awake    Reviewed: Allergy & Precautions, H&P , NPO status , Patient's Chart, lab work & pertinent test results, reviewed documented beta blocker date and time   Airway Mallampati: II  TM Distance: >3 FB Neck ROM: full    Dental no notable dental hx.    Pulmonary neg pulmonary ROS, former smoker,    Pulmonary exam normal breath sounds clear to auscultation       Cardiovascular Exercise Tolerance: Good negative cardio ROS   Rhythm:regular Rate:Normal     Neuro/Psych negative neurological ROS  negative psych ROS   GI/Hepatic negative GI ROS, Neg liver ROS,   Endo/Other  negative endocrine ROS  Renal/GU negative Renal ROS  negative genitourinary   Musculoskeletal   Abdominal   Peds  Hematology  (+) Blood dyscrasia, anemia ,   Anesthesia Other Findings   Reproductive/Obstetrics negative OB ROS                             Anesthesia Physical Anesthesia Plan  ASA: II  Anesthesia Plan: General   Post-op Pain Management:    Induction:   PONV Risk Score and Plan: Ondansetron  Airway Management Planned:   Additional Equipment:   Intra-op Plan:   Post-operative Plan:   Informed Consent: I have reviewed the patients History and Physical, chart, labs and discussed the procedure including the risks, benefits and alternatives for the proposed anesthesia with the patient or authorized representative who has indicated his/her understanding and acceptance.     Dental Advisory Given  Plan Discussed with: CRNA  Anesthesia Plan Comments:         Anesthesia Quick Evaluation

## 2019-08-21 ENCOUNTER — Encounter: Payer: PRIVATE HEALTH INSURANCE | Admitting: Obstetrics & Gynecology

## 2019-08-21 ENCOUNTER — Encounter (HOSPITAL_COMMUNITY): Payer: Self-pay | Admitting: Obstetrics & Gynecology

## 2019-08-21 DIAGNOSIS — D259 Leiomyoma of uterus, unspecified: Secondary | ICD-10-CM | POA: Diagnosis not present

## 2019-08-21 LAB — CBC
HCT: 34.7 % — ABNORMAL LOW (ref 36.0–46.0)
Hemoglobin: 10.6 g/dL — ABNORMAL LOW (ref 12.0–15.0)
MCH: 27.9 pg (ref 26.0–34.0)
MCHC: 30.5 g/dL (ref 30.0–36.0)
MCV: 91.3 fL (ref 80.0–100.0)
Platelets: 188 10*3/uL (ref 150–400)
RBC: 3.8 MIL/uL — ABNORMAL LOW (ref 3.87–5.11)
RDW: 15.5 % (ref 11.5–15.5)
WBC: 8.5 10*3/uL (ref 4.0–10.5)
nRBC: 0 % (ref 0.0–0.2)

## 2019-08-21 LAB — BASIC METABOLIC PANEL
Anion gap: 5 (ref 5–15)
BUN: 16 mg/dL (ref 6–20)
CO2: 26 mmol/L (ref 22–32)
Calcium: 8.4 mg/dL — ABNORMAL LOW (ref 8.9–10.3)
Chloride: 108 mmol/L (ref 98–111)
Creatinine, Ser: 1.01 mg/dL — ABNORMAL HIGH (ref 0.44–1.00)
GFR calc Af Amer: >60 mL/min (ref >60)
GFR calc non Af Amer: >60 mL/min (ref >60)
Glucose, Bld: 134 mg/dL — ABNORMAL HIGH (ref 70–99)
Potassium: 4 mmol/L (ref 3.5–5.1)
Sodium: 139 mmol/L (ref 135–145)

## 2019-08-21 LAB — SURGICAL PATHOLOGY

## 2019-08-21 MED ORDER — OXYCODONE HCL 5 MG PO TABS: 5.0000 mg | ORAL_TABLET | ORAL | 0 refills | Status: DC | PRN

## 2019-08-21 MED ORDER — ONDANSETRON 8 MG PO TBDP
8.0000 mg | ORAL_TABLET | Freq: Three times a day (TID) | ORAL | 0 refills | Status: DC | PRN
Start: 2019-08-21 — End: 2019-10-07

## 2019-08-21 MED ORDER — KETOROLAC TROMETHAMINE 10 MG PO TABS
10.0000 mg | ORAL_TABLET | Freq: Three times a day (TID) | ORAL | 0 refills | Status: DC | PRN
Start: 2019-08-21 — End: 2019-10-07

## 2019-08-21 NOTE — Discharge Instructions (Signed)
Abdominal Hysterectomy, Care After This sheet gives you information about how to care for yourself after your procedure. Your health care provider may also give you more specific instructions. If you have problems or questions, contact your health care provider. What can I expect after the procedure? After your procedure, it is common to have:  Pain.  Fatigue.  Poor appetite.  Less interest in sex.  Vaginal bleeding and discharge. You may need to use a sanitary napkin after this procedure. Follow these instructions at home: Bathing  Do not take baths, swim, or use a hot tub until your health care provider approves. Ask your health care provider if you can take showers. You may only be allowed to take sponge baths for bathing.  Keep the bandage (dressing) dry until your health care provider says it can be removed. Incision care   Follow instructions from your health care provider about how to take care of your incision. Make sure you: ? Wash your hands with soap and water before you change your bandage (dressing). If soap and water are not available, use hand sanitizer. ? Change your dressing as told by your health care provider. ? Leave stitches (sutures), skin glue, or adhesive strips in place. These skin closures may need to stay in place for 2 weeks or longer. If adhesive strip edges start to loosen and curl up, you may trim the loose edges. Do not remove adhesive strips completely unless your health care provider tells you to do that.  Check your incision area every day for signs of infection. Check for: ? Redness, swelling, or pain. ? Fluid or blood. ? Warmth. ? Pus or a bad smell. Activity  Do gentle, daily exercises as told by your health care provider. You may be told to take short walks every day and go farther each time.  Do not lift anything that is heavier than 10 lb (4.5 kg), or the limit that your health care provider tells you, until he or she says that it is  safe.  Do not drive or use heavy machinery while taking prescription pain medicine.  Do not drive for 24 hours if you were given a medicine to help you relax (sedative).  Follow your health care provider's instructions about exercise, driving, and general activities. Ask your health care provider what activities are safe for you. Lifestyle  Do not douche, use tampons, or have sex for at least 6 weeks or as told by your health care provider.  Do not drink alcohol until your health care provider approves.  Drink enough fluid to keep your urine clear or pale yellow.  Try to have someone at home with you for the first 1-2 weeks to help.  Do not use any products that contain nicotine or tobacco, such as cigarettes and e-cigarettes. These can delay healing. If you need help quitting, ask your health care provider. General instructions  Take over-the-counter and prescription medicines only as told by your health care provider.  Do not take aspirin or ibuprofen. These medicines can cause bleeding.  To prevent or treat constipation while you are taking prescription pain medicine, your health care provider may recommend that you: ? Drink enough fluid to keep your urine clear or pale yellow. ? Take over-the-counter or prescription medicines. ? Eat foods that are high in fiber, such as fresh fruits and vegetables, whole grains, and beans. ? Limit foods that are high in fat and processed sugars, such as fried and sweet foods.  Keep all   follow-up visits as told by your health care provider. This is important. Contact a health care provider if:  You have chills or fever.  You have redness, swelling, or pain around your incision.  You have fluid or blood coming from your incision.  Your incision feels warm to the touch.  You have pus or a bad smell coming from your incision.  Your incision breaks open.  You feel dizzy or light-headed.  You have pain or bleeding when you urinate.  You  have persistent diarrhea.  You have persistent nausea and vomiting.  You have abnormal vaginal discharge.  You have a rash.  You have any type of abnormal reaction or you develop an allergy to your medicine.  Your pain medicine does not help. Get help right away if:  You have a fever and your symptoms suddenly get worse.  You have severe abdominal pain.  You have shortness of breath.  You faint.  You have pain, swelling, or redness in your leg.  You have heavy vaginal bleeding with blood clots. Summary  After your procedure, it is common to have pain, fatigue and vaginal discharge.  Do not take baths, swim, or use a hot tub until your health care provider approves. Ask your health care provider if you can take showers. You may only be allowed to take sponge baths for bathing.  Follow your health care provider's instructions about exercise, driving, and general activities. Ask your health care provider what activities are safe for you.  Do not lift anything that is heavier than 10 lb (4.5 kg), or the limit that your health care provider tells you, until he or she says that it is safe.  Try to have someone at home with you for the first 1-2 weeks to help. This information is not intended to replace advice given to you by your health care provider. Make sure you discuss any questions you have with your health care provider. Document Revised: 02/26/2018 Document Reviewed: 01/12/2016 Elsevier Patient Education  2020 [employer]  

## 2019-08-21 NOTE — Plan of Care (Signed)

## 2019-08-21 NOTE — Discharge Summary (Signed)
Physician Discharge Summary  Patient ID: Shelley Doyle MRN: [id number] DOB/AGE: 1965-11-04 54 y.o.  Admit date: 08/20/2019 Discharge date: 08/21/2019  Admission Diagnoses: S/P TAH BSO  Discharge Diagnoses:  Active Problems:   S/P hysterectomy   S/P TAH-BSO (total abdominal hysterectomy and bilateral salpingo-oophorectomy)   Discharged Condition: good  Hospital Course: unremarkable post op course  Consults: None  Significant Diagnostic Studies: labs:   Results for orders placed or performed during the hospital encounter of 08/20/19 (from the past 24 hour(s))  CBC     Status: Abnormal   Collection Time: 08/21/19  6:30 AM  Result Value Ref Range   WBC 8.5 4.0 - 10.5 K/uL   RBC 3.80 (L) 3.87 - 5.11 MIL/uL   Hemoglobin 10.6 (L) 12.0 - 15.0 g/dL   HCT 34.7 (L) 36 - 46 %   MCV 91.3 80.0 - 100.0 fL   MCH 27.9 26.0 - 34.0 pg   MCHC 30.5 30.0 - 36.0 g/dL   RDW 15.5 11.5 - 15.5 %   Platelets 188 150 - 400 K/uL   nRBC 0.0 0.0 - 0.2 %  Basic metabolic panel     Status: Abnormal   Collection Time: 08/21/19  6:30 AM  Result Value Ref Range   Sodium 139 135 - 145 mmol/L   Potassium 4.0 3.5 - 5.1 mmol/L   Chloride 108 98 - 111 mmol/L   CO2 26 22 - 32 mmol/L   Glucose, Bld 134 (H) 70 - 99 mg/dL   BUN 16 6 - 20 mg/dL   Creatinine, Ser 1.01 (H) 0.44 - 1.00 mg/dL   Calcium 8.4 (L) 8.9 - 10.3 mg/dL   GFR calc non Af Amer >60 >60 mL/min   GFR calc Af Amer >60 >60 mL/min   Anion gap 5 5 - 15    Treatments: surgery: TAH BSO  Discharge Exam: Blood pressure 108/88, pulse 83, temperature 98.6 F (37 C), temperature source Oral, resp. rate 18, height 5\' 9"  (1.753 m), weight 77.1 kg, SpO2 99 %. General appearance: alert, cooperative and no distress GI: soft, non-tender; bowel sounds normal; no masses,  no organomegaly Incision/Wound:clean dry intact  Disposition: Discharge disposition: 01-Home or Self Care       Discharge Instructions    Call MD for:  persistant nausea and  vomiting   Complete by: As directed    Call MD for:  severe uncontrolled pain   Complete by: As directed    Call MD for:  temperature >100.4   Complete by: As directed    Diet - low sodium heart healthy   Complete by: As directed    Driving Restrictions   Complete by: As directed    No driving for 1 week   Increase activity slowly   Complete by: As directed    Leave dressing on - Keep it clean, dry, and intact until clinic visit   Complete by: As directed    Lifting restrictions   Complete by: As directed    Do not lift more than 10 pounds for 6 weeks   Sexual Activity Restrictions   Complete by: As directed    No sex for 8 weeks     Allergies as of 08/21/2019   No Known Allergies     Medication List    TAKE these medications   Biotin 1000 MCG tablet Take 1,000 mcg by mouth daily.   ketorolac 10 MG tablet Commonly known as: TORADOL Take 1 tablet (10 mg total) by mouth every 8 (  eight) hours as needed.   MELATONIN PO Take 1 tablet by mouth daily as needed (sleep).   ondansetron 8 MG disintegrating tablet Commonly known as: Zofran ODT Take 1 tablet (8 mg total) by mouth every 8 (eight) hours as needed for nausea or vomiting.   oxyCODONE 5 MG immediate release tablet Commonly known as: Oxy IR/ROXICODONE Take 1-2 tablets (5-10 mg total) by mouth every 4 (four) hours as needed for moderate pain.            Discharge Care Instructions  (From admission, onward)         Start     Ordered   08/21/19 0000  Leave dressing on - Keep it clean, dry, and intact until clinic visit        08/21/19 1241          Follow-up Information    Florian Buff, MD Follow up on 09/01/2019.   Specialties: Obstetrics and Gynecology, Radiology Why: post op Contact information: [place] [id number] [phone]               Signed: Florian Buff 08/21/2019, 12:42 PM

## 2019-09-01 ENCOUNTER — Encounter: Payer: Self-pay | Admitting: Obstetrics & Gynecology

## 2019-09-01 ENCOUNTER — Ambulatory Visit (INDEPENDENT_AMBULATORY_CARE_PROVIDER_SITE_OTHER): Payer: 59 | Admitting: Obstetrics & Gynecology

## 2019-09-01 VITALS — BP 127/79 | HR 102 | Ht 69.75 in | Wt 173.0 lb

## 2019-09-01 DIAGNOSIS — Z9079 Acquired absence of other genital organ(s): Secondary | ICD-10-CM

## 2019-09-01 DIAGNOSIS — Z90710 Acquired absence of both cervix and uterus: Secondary | ICD-10-CM

## 2019-09-01 DIAGNOSIS — Z90722 Acquired absence of ovaries, bilateral: Secondary | ICD-10-CM

## 2019-09-01 NOTE — Progress Notes (Signed)
  HPI: Patient returns for routine postoperative follow-up having undergone TAH BSO on 08/20/19.  The patient's immediate postoperative recovery has been unremarkable. Since hospital discharge the patient reports no problems doing well.   Current Outpatient Medications: Biotin 1000 MCG tablet, Take 1,000 mcg by mouth daily., Disp: , Rfl:  MELATONIN PO, Take 1 tablet by mouth daily as needed (sleep)., Disp: , Rfl:   ketorolac (TORADOL) 10 MG tablet, Take 1 tablet (10 mg total) by mouth every 8 (eight) hours as needed. (Patient not taking: Reported on 09/01/2019), Disp: 15 tablet, Rfl: 0 ondansetron (ZOFRAN ODT) 8 MG disintegrating tablet, Take 1 tablet (8 mg total) by mouth every 8 (eight) hours as needed for nausea or vomiting. (Patient not taking: Reported on 09/01/2019), Disp: 8 tablet, Rfl: 0 oxyCODONE (OXY IR/ROXICODONE) 5 MG immediate release tablet, Take 1-2 tablets (5-10 mg total) by mouth every 4 (four) hours as needed for moderate pain. (Patient not taking: Reported on 09/01/2019), Disp: 30 tablet, Rfl: 0  No current facility-administered medications for this visit.    Blood pressure 127/79, pulse 102, height 5' 9.75" (1.772 m), weight 173 lb (78.5 kg).  Physical Exam: Incision clean dry intact  Diagnostic Tests:   Pathology: benign  Impression: S/p TAH BSO  Plan:   Follow up: 5  weeks  Florian Buff, MD

## 2019-09-16 ENCOUNTER — Telehealth: Payer: Self-pay | Admitting: Obstetrics & Gynecology

## 2019-09-16 ENCOUNTER — Encounter: Payer: Self-pay | Admitting: Obstetrics & Gynecology

## 2019-09-16 NOTE — Telephone Encounter (Signed)
Is going back to work tomorrow / had appt with him but she needs a note from [place] saying that she can come back and it needs to say regular duty. PLease call and let her know when she can pick

## 2019-09-16 NOTE — Telephone Encounter (Signed)
Should have printed out

## 2019-09-24 NOTE — Op Note (Signed)
Preoperative diagnosis:  1.  20 week size fibroid uterus                                          2.  50% growth in 4 years                                         3.  Menorrhagia with history of blood                                                              transfusion                                           Postoperative diagnosis:  Same as above   Procedure:  Abdominal hysterectomy, total with removal of both tubes and ovaries  Surgeon:  Florian Buff  Assistant:    Anesthesia:  General endotracheal  Preoperative clinical summary:    Intraoperative findings: 26 weeks size fibroids, multiple fibroids  Description of operation:  Patient was taken to the operating room and placed in the supine position where she underwent general endotracheal anesthesia.  She was then prepped and draped in the usual sterile fashion and a Foley catheter was placed for continuous bladder drainage.  A Pfannenstiel skin incision was made and carried down sharply to the rectus fascia which was scored in the midline and extended laterally.  The fascia was taken off the muscles superiorly and inferiorly without difficulty.  The muscles were divided.  The peritoneal cavity was entered.  An medium Alexis self-retaining retractor was placed.  The upper abdomen was packed away. Both uterine cornu were grasped with Coker clamps.  The left round ligament was suture ligated and coagulated with the electrocautery unit.  The left vesicouterine serosal flap was created.  An avascular window in in the peritoneum was created and the utero-ovarian ligament was cross clamped, cut and suture ligated.  The right round ligament was suture ligated and cut with the electrocautery unit.  The vesicouterine serosal flap on the right was created.  An avascular window in the peritoneum was created and the right utero-ovarian ligament was cross clamped, cut and double suture ligated.   The uterine vessels were skeletonized bilaterally.   The uterine vessels were clamped bilaterally,  then cut and suture ligated.  Two more pedicles were taken down the cervix medial to the uterine vessels.  Each pedicle was clamped cut and suture ligated with good resulting hemostasis. The vagina was cross clamped and the specimen was removed.  The vagina was closed. The pelvis was irrigated vigorously and all pedicles were examined and found to be hemostatic.  Both Fallpoian tubes and ovaries were then  removed by cross clamping with a Kelley clamp and a fore and aft 3-0 monocryl suture for hemostasis.  All specimens were sent to pathology for routine evaluation.  The Alexis self-retaining retractor was removed and the pelvis was irrigated vigorously.  All packs were removed and all  counts were correct at this point x 3.  The muscles and peritoneum were reapproximated loosely.  The fascia was closed with 0 Vicryl running.  The subcutaneous tissue was reapproximated using 2-0 plain gut.  The skin was closed using 3-0 Vicryl on a Keith needle in a subcuticular fashion.  Dermabond was then applied for additional wound integrity and to serve as a postoperative bacterial barrier.  The patient was awakened from anesthesia taken to the recovery room in good stable condition. All sponge instrument and needle counts were correct x 3.  The patient received Ancef and Toradol prophylactically preoperatively.  Estimated blood loss for the procedure was 1000  cc.  Florian Buff, MD  09/24/2019 10:32 PM

## 2019-10-07 ENCOUNTER — Encounter: Payer: Self-pay | Admitting: Obstetrics & Gynecology

## 2019-10-07 ENCOUNTER — Ambulatory Visit (INDEPENDENT_AMBULATORY_CARE_PROVIDER_SITE_OTHER): Payer: 59 | Admitting: Obstetrics & Gynecology

## 2019-10-07 VITALS — BP 148/93 | HR 85 | Ht 69.75 in | Wt 173.0 lb

## 2019-10-07 DIAGNOSIS — Z90710 Acquired absence of both cervix and uterus: Secondary | ICD-10-CM

## 2019-10-07 DIAGNOSIS — Z90722 Acquired absence of ovaries, bilateral: Secondary | ICD-10-CM

## 2019-10-07 DIAGNOSIS — Z9079 Acquired absence of other genital organ(s): Secondary | ICD-10-CM

## 2019-10-07 NOTE — Progress Notes (Signed)
  HPI: Patient returns for routine postoperative follow-up having undergone TAH BSO on 08/21/19.  The patient's immediate postoperative recovery has been unremarkable. Since hospital discharge the patient reports no problems.   Current Outpatient Medications: Biotin 1000 MCG tablet, Take 1,000 mcg by mouth daily., Disp: , Rfl:  MELATONIN PO, Take 1 tablet by mouth daily as needed (sleep)., Disp: , Rfl:    No current facility-administered medications for this visit.    Blood pressure (!) 148/93, pulse 85, height 5' 9.75" (1.772 m), weight 173 lb (78.5 kg), last menstrual period 06/22/2019.  Physical Exam: Incision clean dry intact Vaginal cuff well healed  Diagnostic Tests:   Pathology: benign  Impression: S/p TAH BSO doing well  Plan:   Follow up: prn    Florian Buff, MD

## 2019-12-05 ENCOUNTER — Ambulatory Visit: Payer: 59 | Admitting: Obstetrics & Gynecology

## 2020-02-16 ENCOUNTER — Ambulatory Visit
Admission: EM | Admit: 2020-02-16 | Discharge: 2020-02-16 | Disposition: A | Discharge status: Home / Self Care | Payer: 59 | Attending: Emergency Medicine | Admitting: Emergency Medicine

## 2020-02-16 DIAGNOSIS — Z1152 Encounter for screening for COVID-19: Secondary | ICD-10-CM

## 2020-02-16 NOTE — ED Triage Notes (Signed)
Patient presents to Urgent Care with complaints of cough, runny nose, generalized body aches. Pt declines to see provider at this time wants covid test only.   Denies fever.

## 2020-02-16 NOTE — ED Notes (Signed)
Patient declined to see provider today. Pt request only a covid test.

## 2020-02-18 LAB — COVID-19, FLU A+B NAA
Influenza A, NAA: NOT DETECTED
Influenza B, NAA: NOT DETECTED
SARS-CoV-2, NAA: DETECTED — AB

## 2020-12-15 ENCOUNTER — Other Ambulatory Visit: Payer: Self-pay

## 2020-12-15 ENCOUNTER — Encounter (HOSPITAL_COMMUNITY): Payer: Self-pay | Admitting: *Deleted

## 2020-12-15 DIAGNOSIS — K5792 Diverticulitis of intestine, part unspecified, without perforation or abscess without bleeding: Secondary | ICD-10-CM | POA: Insufficient documentation

## 2020-12-15 DIAGNOSIS — Z87891 Personal history of nicotine dependence: Secondary | ICD-10-CM | POA: Insufficient documentation

## 2020-12-15 NOTE — ED Triage Notes (Signed)
Pt with abd pain x 3 days.  Pt with no appetite.  LBM yesterday and normal per pt. + nausea and had some emesis on day 1

## 2020-12-16 ENCOUNTER — Emergency Department (HOSPITAL_COMMUNITY)
Admission: EM | Admit: 2020-12-16 | Discharge: 2020-12-16 | Disposition: A | Discharge status: Home / Self Care | Payer: Self-pay | Attending: Emergency Medicine | Admitting: Emergency Medicine

## 2020-12-16 ENCOUNTER — Emergency Department (HOSPITAL_COMMUNITY): Payer: Self-pay

## 2020-12-16 DIAGNOSIS — K5792 Diverticulitis of intestine, part unspecified, without perforation or abscess without bleeding: Secondary | ICD-10-CM

## 2020-12-16 LAB — CBC
HCT: 38.4 % (ref 36.0–46.0)
Hemoglobin: 12.4 g/dL (ref 12.0–15.0)
MCH: 27.9 pg (ref 26.0–34.0)
MCHC: 32.3 g/dL (ref 30.0–36.0)
MCV: 86.5 fL (ref 80.0–100.0)
Platelets: 214 10*3/uL (ref 150–400)
RBC: 4.44 MIL/uL (ref 3.87–5.11)
RDW: 14.6 % (ref 11.5–15.5)
WBC: 7.9 10*3/uL (ref 4.0–10.5)
nRBC: 0 % (ref 0.0–0.2)

## 2020-12-16 LAB — COMPREHENSIVE METABOLIC PANEL
ALT: 39 U/L (ref 0–44)
AST: 28 U/L (ref 15–41)
Albumin: 4.1 g/dL (ref 3.5–5.0)
Alkaline Phosphatase: 76 U/L (ref 38–126)
Anion gap: 9 (ref 5–15)
BUN: 11 mg/dL (ref 6–20)
CO2: 25 mmol/L (ref 22–32)
Calcium: 9.1 mg/dL (ref 8.9–10.3)
Chloride: 103 mmol/L (ref 98–111)
Creatinine, Ser: 0.9 mg/dL (ref 0.44–1.00)
GFR, Estimated: >60 mL/min (ref >60)
Glucose, Bld: 131 mg/dL — ABNORMAL HIGH (ref 70–99)
Potassium: 3.8 mmol/L (ref 3.5–5.1)
Sodium: 137 mmol/L (ref 135–145)
Total Bilirubin: 0.6 mg/dL (ref 0.3–1.2)
Total Protein: 7.3 g/dL (ref 6.5–8.1)

## 2020-12-16 LAB — URINALYSIS, ROUTINE W REFLEX MICROSCOPIC
Bilirubin Urine: NEGATIVE
Glucose, UA: NEGATIVE mg/dL
Hgb urine dipstick: NEGATIVE
Ketones, ur: 5 mg/dL — AB
Leukocytes,Ua: NEGATIVE
Nitrite: NEGATIVE
Protein, ur: NEGATIVE mg/dL
Specific Gravity, Urine: >1.046 — ABNORMAL HIGH (ref 1.005–1.030)
pH: 6 (ref 5.0–8.0)

## 2020-12-16 LAB — LIPASE, BLOOD: Lipase: 37 U/L (ref 11–51)

## 2020-12-16 MED ORDER — METRONIDAZOLE 500 MG PO TABS
500.0000 mg | ORAL_TABLET | Freq: Once | ORAL | Status: AC
  Administered 2020-12-16: 500 mg via ORAL
  Filled 2020-12-16: qty 1

## 2020-12-16 MED ORDER — FENTANYL CITRATE PF 50 MCG/ML IJ SOSY
50.0000 ug | PREFILLED_SYRINGE | Freq: Once | INTRAMUSCULAR | Status: AC
  Administered 2020-12-16: 50 ug via INTRAVENOUS
  Filled 2020-12-16: qty 1

## 2020-12-16 MED ORDER — HYDROCODONE-ACETAMINOPHEN 5-325 MG PO TABS: 1.0000 | ORAL_TABLET | Freq: Four times a day (QID) | ORAL | 0 refills | Status: AC | PRN

## 2020-12-16 MED ORDER — IOHEXOL 300 MG/ML  SOLN
100.0000 mL | Freq: Once | INTRAMUSCULAR | Status: AC | PRN
  Administered 2020-12-16: 100 mL via INTRAVENOUS

## 2020-12-16 MED ORDER — CIPROFLOXACIN HCL 500 MG PO TABS: 500.0000 mg | ORAL_TABLET | Freq: Two times a day (BID) | ORAL | 0 refills | Status: AC

## 2020-12-16 MED ORDER — METRONIDAZOLE 500 MG PO TABS: 500.0000 mg | ORAL_TABLET | Freq: Two times a day (BID) | ORAL | 0 refills | Status: AC

## 2020-12-16 MED ORDER — CIPROFLOXACIN HCL 250 MG PO TABS
500.0000 mg | ORAL_TABLET | Freq: Once | ORAL | Status: AC
  Administered 2020-12-16: 500 mg via ORAL
  Filled 2020-12-16: qty 2

## 2020-12-16 NOTE — ED Provider Notes (Signed)
[facility] EMERGENCY DEPARTMENT Provider Note   CSN: [id number] Arrival date & time: 12/15/20  2149     History Chief Complaint  Patient presents with   Abdominal Pain    Shelley Doyle is a 55 y.o. female.  The history is provided by the patient.  Abdominal Pain Pain location:  LLQ Pain quality: sharp   Pain radiates to:  RLQ Pain severity:  Moderate Onset quality:  Gradual Duration:  3 days Timing:  Intermittent Progression:  Worsening Chronicity:  New Relieved by:  Nothing Worsened by:  Palpation and movement Associated symptoms: nausea   Associated symptoms: no chest pain, no diarrhea and no fever   Patient reports onset of lower abdominal pain has been worsening over the past 3 days.  She does not recall having this pain previously.    Past Medical History:  Diagnosis Date   Heavy menses    History of blood transfusion    Iron deficiency anemia    Uterine fibroid 04/13/2015    Patient Active Problem List   Diagnosis Date Noted   S/P hysterectomy 08/20/2019   S/P TAH-BSO (total abdominal hysterectomy and bilateral salpingo-oophorectomy) 08/20/2019   Closed fracture of left distal radius 12/03/16 01/18/2017   Uterine fibroid 04/13/2015   Symptomatic anemia 04/12/2015   Near syncope 04/12/2015   DUB (dysfunctional uterine bleeding) 04/12/2015   AKI (acute kidney injury) ([place]) 04/12/2015    Past Surgical History:  Procedure Laterality Date   HYSTERECTOMY ABDOMINAL WITH SALPINGO-OOPHORECTOMY N/A 08/20/2019   Procedure: TOTAL ABDOMINAL HYSTERECTOMY WITH SALPINGO OOPHORECTOMY;  Surgeon: Florian Buff, MD;  Location: [facility];  Service: Gynecology;  Laterality: N/A;   TUBAL LIGATION       OB History   No obstetric history on file.     Family History  Problem Relation Age of Onset   Diabetes Mother    Heart failure Mother    COPD Father    Diabetes Sister    Diabetes Brother     Social History   Tobacco Use   Smoking status: Former   Smokeless  tobacco: Never  Scientific laboratory technician Use: Never used  Substance Use Topics   Alcohol use: No   Drug use: No    Home Medications Prior to Admission medications   Medication Sig Start Date End Date Taking? Authorizing Provider  ciprofloxacin (CIPRO) 500 MG tablet Take 1 tablet (500 mg total) by mouth 2 (two) times daily. 12/16/20  Yes Ripley Fraise, MD  metroNIDAZOLE (FLAGYL) 500 MG tablet Take 1 tablet (500 mg total) by mouth 2 (two) times daily. One po bid x 7 days 12/16/20  Yes Ripley Fraise, MD  Biotin 1000 MCG tablet Take 1,000 mcg by mouth daily.    [provider]  MELATONIN PO Take 1 tablet by mouth daily as needed (sleep).    [provider]    Allergies    Patient has no known allergies.  Review of Systems   Review of Systems  Constitutional:  Negative for fever.  Cardiovascular:  Negative for chest pain.  Gastrointestinal:  Positive for abdominal pain and nausea. Negative for diarrhea.  All other systems reviewed and are negative.  Physical Exam Updated Vital Signs BP 123/81   Pulse 90   Temp 98.9 F (37.2 C) (Oral)   Resp 19   Ht 1.772 m (5' 9.75")   Wt 75.3 kg   LMP 06/22/2019   SpO2 100%   BMI 23.99 kg/m   Physical Exam CONSTITUTIONAL:  Well developed/well nourished HEAD: Normocephalic/atraumatic EYES: EOMI/PERRL ENMT: Mucous membranes moist NECK: supple no meningeal signs SPINE/BACK:entire spine nontender CV: S1/S2 noted, no murmurs/rubs/gallops noted LUNGS: Lungs are clear to auscultation bilaterally, no apparent distress ABDOMEN: soft, moderate RLQ/LLQ tenderness, no rebound or guarding, bowel sounds noted throughout abdomen GU:no cva tenderness NEURO: Pt is awake/alert/appropriate, moves all extremitiesx4.  No facial droop.   EXTREMITIES: pulses normal/equal, full ROM SKIN: warm, color normal PSYCH: no abnormalities of mood noted, alert and oriented to situation  ED Results / Procedures / Treatments   Labs (all labs  ordered are listed, but only abnormal results are displayed) Labs Reviewed  COMPREHENSIVE METABOLIC PANEL - Abnormal; Notable for the following components:      Result Value   Glucose, Bld 131 (*)    All other components within normal limits  LIPASE, BLOOD  CBC  URINALYSIS, ROUTINE W REFLEX MICROSCOPIC    EKG EKG Interpretation  Date/Time:  Thursday December 16 2020 01:24:05 EST Ventricular Rate:  99 PR Interval:  153 QRS Duration: 97 QT Interval:  354 QTC Calculation: 455 R Axis:   28 Text Interpretation: Sinus rhythm Low voltage, precordial leads Baseline wander in lead(s) V3 Confirmed by Ripley Fraise [phone]) on 12/16/2020 1:28:28 AM  Radiology CT ABDOMEN PELVIS W CONTRAST  Result Date: 12/16/2020 CLINICAL DATA:  Abdominal pain x3 days, nausea/vomiting EXAM: CT ABDOMEN AND PELVIS WITH CONTRAST TECHNIQUE: Multidetector CT imaging of the abdomen and pelvis was performed using the standard protocol following bolus administration of intravenous contrast. CONTRAST:  152mL OMNIPAQUE IOHEXOL 300 MG/ML  SOLN COMPARISON:  07/05/2019 FINDINGS: Lower chest: Lung bases are clear. Hepatobiliary: 13 mm cyst in segment 4A (series 2/image 18). 11 mm cyst versus hemangioma in segment 6 (series 2/image 27). Gallbladder is unremarkable. No intrahepatic or extrahepatic duct dilatation. Pancreas: Within normal limits. Spleen: Within normal limits. Adrenals/Urinary Tract: Adrenal glands are within normal limits. Kidneys are within normal limits.  No hydronephrosis. Bladder is within normal limits. Stomach/Bowel: Stomach is within normal limits. No evidence of bowel obstruction. Normal appendix (series 2/image 86). Sigmoid diverticulitis with pericolonic inflammatory changes/stranding (series 2/image 70). No drainable fluid collection/abscess. No free air to suggest macroscopic perforation. Vascular/Lymphatic: No evidence of abdominal aortic aneurysm. No suspicious abdominopelvic lymphadenopathy.  Reproductive: Status post hysterectomy. No adnexal masses. Other: No abdominopelvic ascites. Musculoskeletal: Visualized osseous structures are within normal limits. IMPRESSION: Sigmoid diverticulitis, without evidence complication. Electronically Signed   By: Julian Hy M.D.   On: 12/16/2020 03:52    Procedures Procedures   Medications Ordered in ED Medications  iohexol (OMNIPAQUE) 300 MG/ML solution 100 mL (100 mLs Intravenous Contrast Given 12/16/20 0323)  fentaNYL (SUBLIMAZE) injection 50 mcg (50 mcg Intravenous Given 12/16/20 0319)  ciprofloxacin (CIPRO) tablet 500 mg (500 mg Oral Given 12/16/20 0420)  metroNIDAZOLE (FLAGYL) tablet 500 mg (500 mg Oral Given 12/16/20 0420)    ED Course  I have reviewed the triage vital signs and the nursing notes.  Pertinent labs & imaging results that were available during my care of the patient were reviewed by me and considered in my medical decision making (see chart for details).    MDM Rules/Calculators/A&P                           This patient presents to the ED for concern of abdominal pain, this involves an extensive number of treatment options, and is a complaint that carries with it a high risk of complications and morbidity.  The differential diagnosis includes bowel obstruction, bowel perforation, diverticulitis, colitis, appendicitis, pancreatitis   Lab Tests:  I Ordered, reviewed, and interpreted labs, which included electrolytes, liver function panel, complete blood count  Medicines ordered:  I ordered medication fentanyl for pain  Imaging Studies ordered:  I ordered imaging studies which included CT abdomen pelvis  I independently visualized and interpreted imaging which showed acute diverticulitis  Additional history obtained:   Previous records obtained and reviewed    Reevaluation:  After the interventions stated above, I reevaluated the patient and found pt is improved  Patient found to have  diverticulitis.  No other complicating findings.  Patient feels improved.  She is able to take her medications here.  She will be discharged home.  We discussed strict return precaution.  Advised need to follow-up with GI for need for colonoscopy.   Final Clinical Impression(s) / ED Diagnoses Final diagnoses:  Diverticulitis    Rx / DC Orders ED Discharge Orders          Ordered    ciprofloxacin (CIPRO) 500 MG tablet  2 times daily        12/16/20 0423    metroNIDAZOLE (FLAGYL) 500 MG tablet  2 times daily        12/16/20 0423             Ripley Fraise, MD 12/16/20 [phone]

## 2022-04-01 IMAGING — CT CT ABD-PELV W/ CM
2 of 5 series · 16 of 46 positions shown, 18 images · IV contrast (Omnipaque or Isovue)
Comparison: 07/05/2019

CLINICAL DATA: Abdominal pain x3 days, nausea/vomiting

EXAM:
CT ABDOMEN AND PELVIS WITH CONTRAST
TECHNIQUE: Multidetector CT imaging of the abdomen and pelvis was performed
using the standard protocol following bolus administration of
intravenous contrast.
CONTRAST:  100mL OMNIPAQUE IOHEXOL 300 MG/ML  SOLN

[Series 2: axial st · axial · 0.66mm/px · z∈[+970,+1395]mm · 13 of 97 slices shown, 15 images]
[im 6/97  soft-tissue]
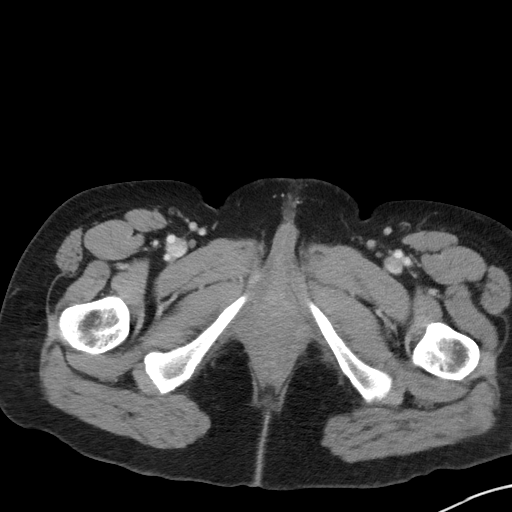
[im 6/97  bone]
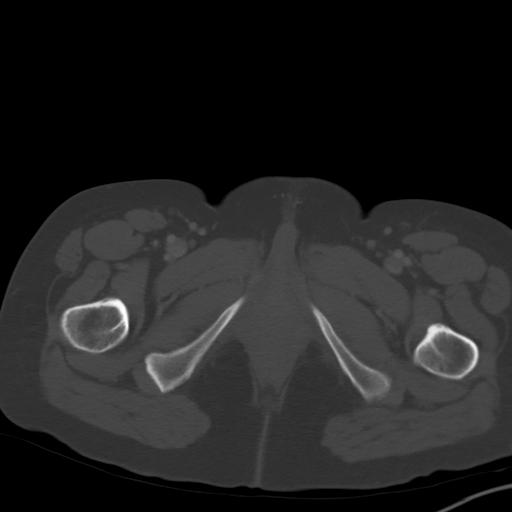
[im 11/97  soft-tissue]
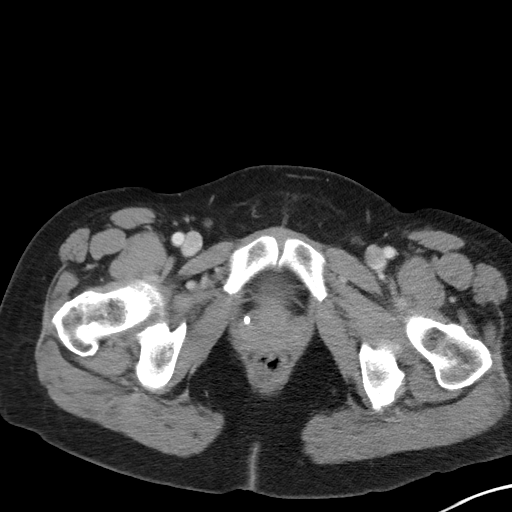
[im 22/97  soft-tissue]
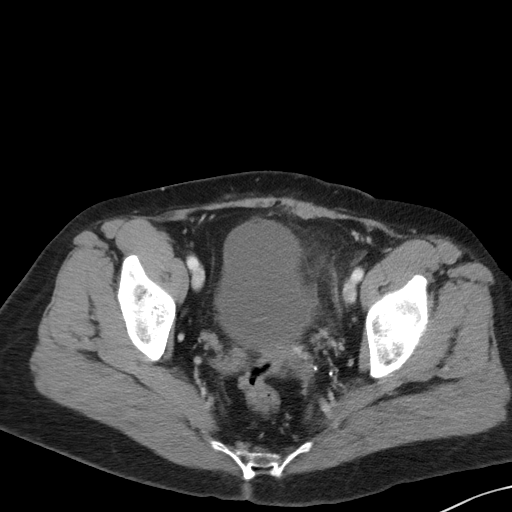
[im 27/97  soft-tissue]
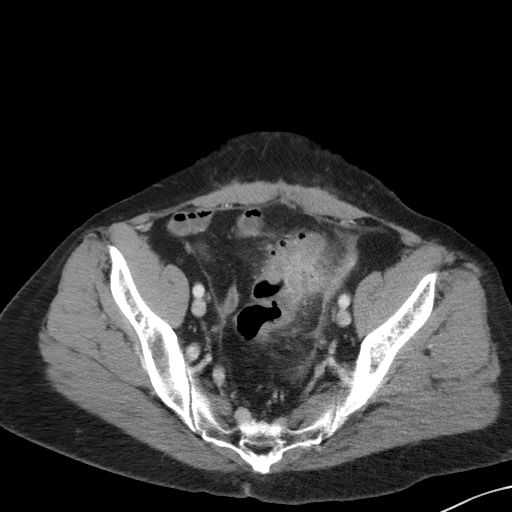
[im 33/97  soft-tissue]
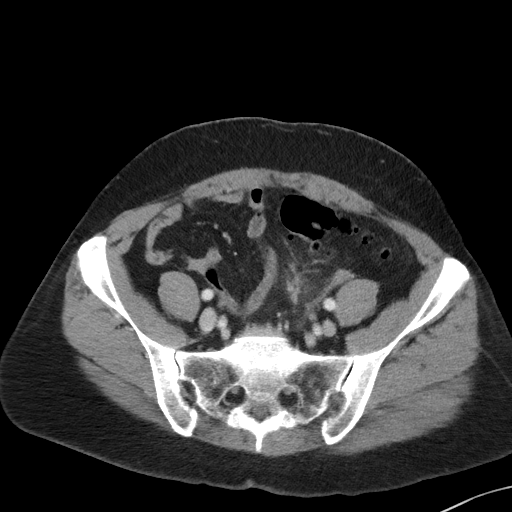
[im 43/97  soft-tissue]
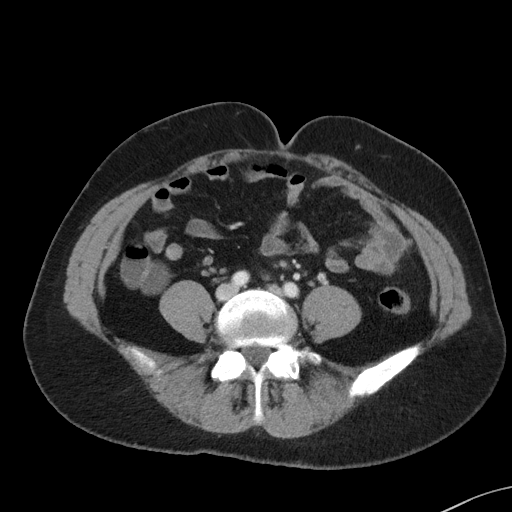
[im 49/97  soft-tissue]
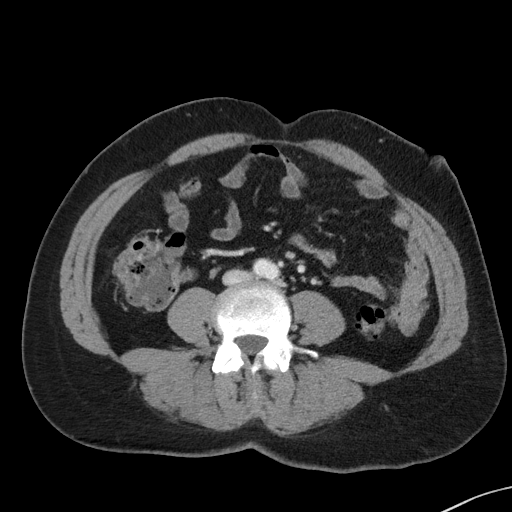
[im 54/97  soft-tissue]
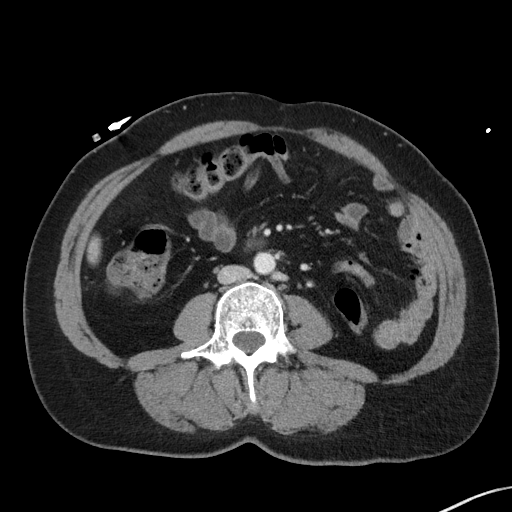
[im 65/97  soft-tissue]
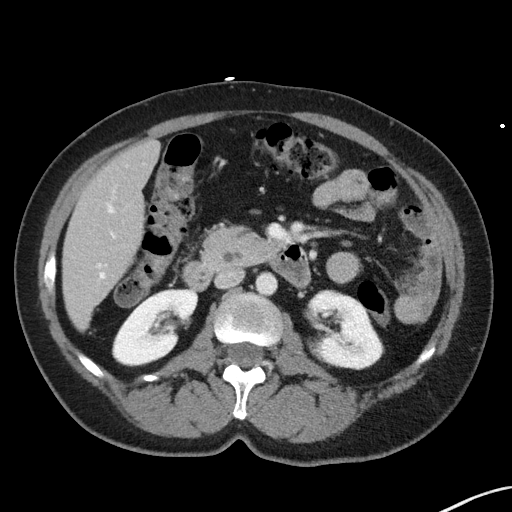
[im 65/97  bone]
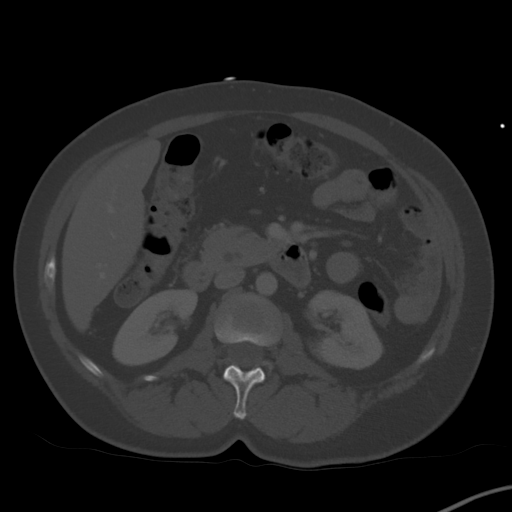
[im 70/97  soft-tissue]
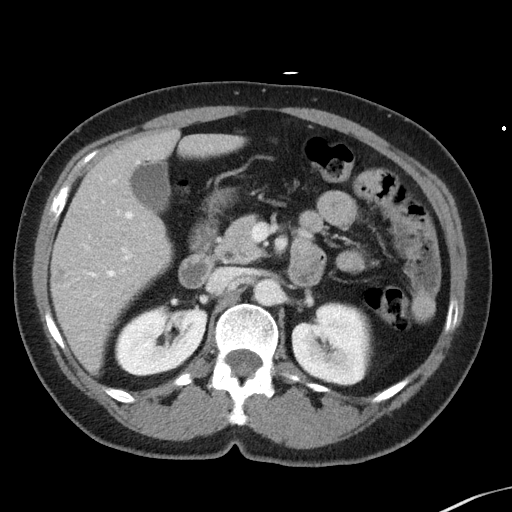
[im 75/97  soft-tissue]
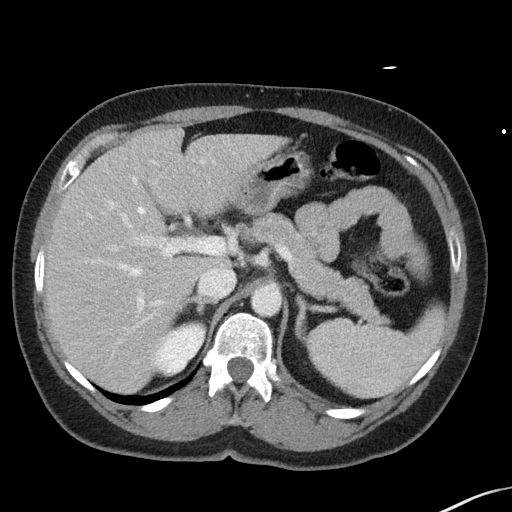
[im 86/97  soft-tissue]
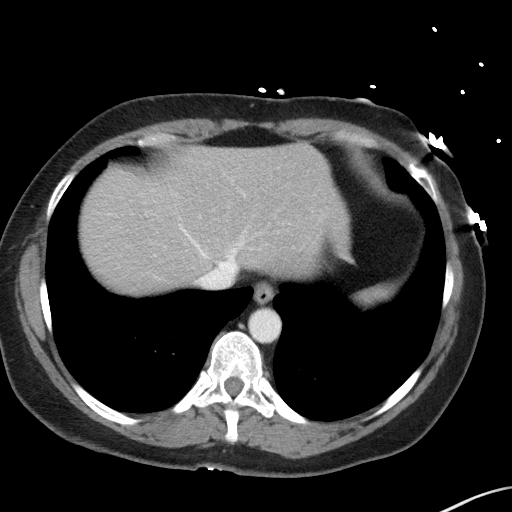
[im 91/97  soft-tissue]
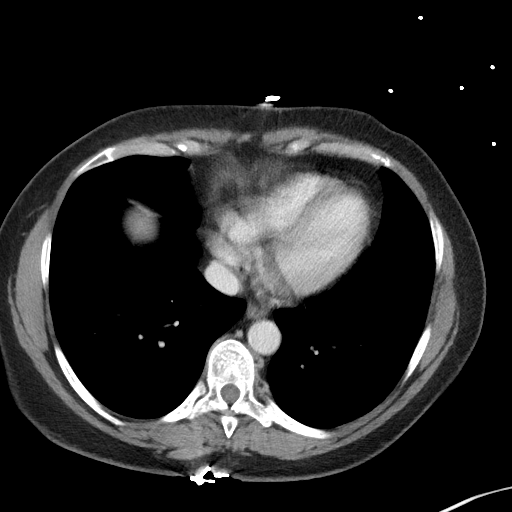

[Series 5: coronal st · coronal · 0.67mm/px · 3 of 80 slices shown]
[im 27/80  soft-tissue]
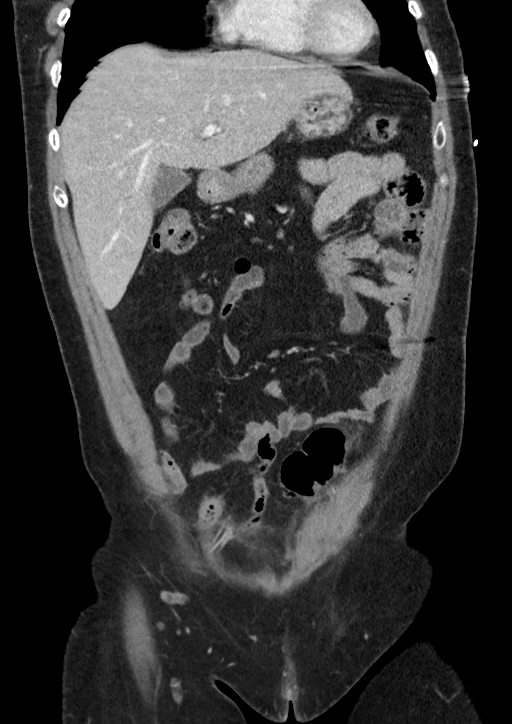
[im 36/80  soft-tissue]
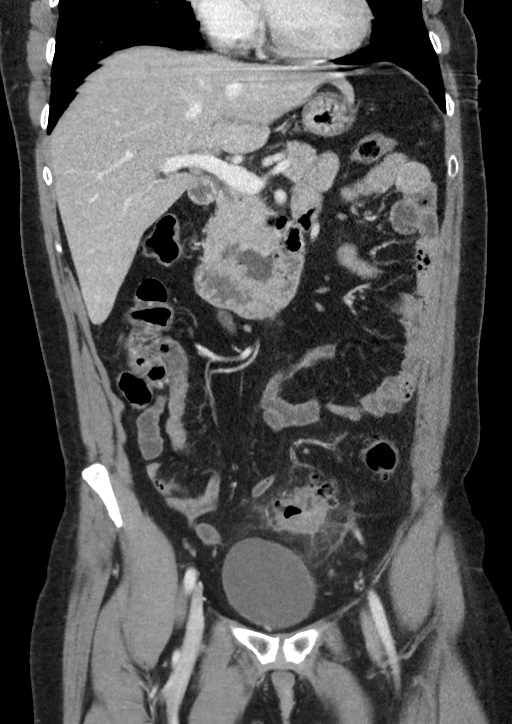
[im 44/80  soft-tissue]
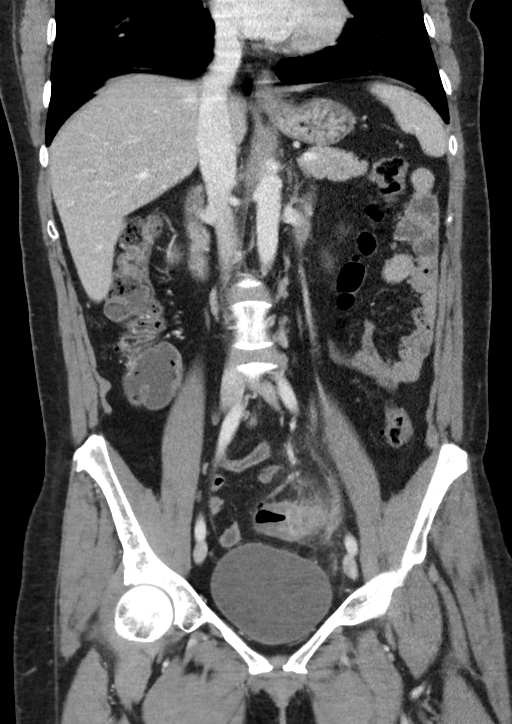

[16 of 46 positions shown; findings below may reference images not displayed]

FINDINGS: Lower chest: Lung bases are clear.

Hepatobiliary: 13 mm cyst in segment 4A (series 2/image 18). 11 mm
cyst versus hemangioma in segment 6 (series 2/image 27).

Gallbladder is unremarkable. No intrahepatic or extrahepatic duct
dilatation.

Pancreas: Within normal limits.

Spleen: Within normal limits.

Adrenals/Urinary Tract: Adrenal glands are within normal limits.

Kidneys are within normal limits.  No hydronephrosis.

Bladder is within normal limits.

Stomach/Bowel: Stomach is within normal limits.

No evidence of bowel obstruction.

Normal appendix (series 2/image 86).

Sigmoid diverticulitis with pericolonic inflammatory
changes/stranding (series 2/image 70). No drainable fluid
collection/abscess. No free air to suggest macroscopic perforation.

Vascular/Lymphatic: No evidence of abdominal aortic aneurysm.

No suspicious abdominopelvic lymphadenopathy.

Reproductive: Status post hysterectomy.

No adnexal masses.

Other: No abdominopelvic ascites.

Musculoskeletal: Visualized osseous structures are within normal
limits.
IMPRESSION: Sigmoid diverticulitis, without evidence complication.

## 2022-09-21 ENCOUNTER — Other Ambulatory Visit: Payer: Self-pay

## 2022-09-21 ENCOUNTER — Encounter (HOSPITAL_COMMUNITY): Payer: Self-pay

## 2022-09-21 ENCOUNTER — Emergency Department (HOSPITAL_COMMUNITY)
Admission: EM | Admit: 2022-09-21 | Discharge: 2022-09-22 | Disposition: A | Discharge status: Home / Self Care | Payer: Self-pay | Attending: Emergency Medicine | Admitting: Emergency Medicine

## 2022-09-21 ENCOUNTER — Emergency Department (HOSPITAL_COMMUNITY): Payer: Self-pay

## 2022-09-21 DIAGNOSIS — U071 COVID-19: Secondary | ICD-10-CM | POA: Insufficient documentation

## 2022-09-21 MED ORDER — KETOROLAC TROMETHAMINE 30 MG/ML IJ SOLN
30.0000 mg | Freq: Once | INTRAMUSCULAR | Status: AC
  Administered 2022-09-22: 30 mg via INTRAMUSCULAR
  Filled 2022-09-21: qty 1

## 2022-09-21 MED ORDER — ACETAMINOPHEN 500 MG PO TABS
1000.0000 mg | ORAL_TABLET | Freq: Once | ORAL | Status: DC
  Filled 2022-09-21: qty 2

## 2022-09-21 MED ORDER — ACETAMINOPHEN 325 MG PO TABS
650.0000 mg | ORAL_TABLET | Freq: Once | ORAL | Status: AC
  Administered 2022-09-21: 650 mg via ORAL
  Filled 2022-09-21: qty 2

## 2022-09-21 NOTE — ED Triage Notes (Signed)
Pt presents with coughing, sneezing, wheezing and sore throat. Pt states she works in [employer] and is exposed to a lot of sick patients with mask. Pt states her head hurts the worst.

## 2022-09-21 NOTE — ED Notes (Signed)
Patient transported to X-ray 

## 2022-09-22 ENCOUNTER — Emergency Department (HOSPITAL_COMMUNITY)
Admission: EM | Admit: 2022-09-22 | Discharge: 2022-09-22 | Disposition: A | Discharge status: Home / Self Care | Payer: Self-pay | Attending: Emergency Medicine | Admitting: Emergency Medicine

## 2022-09-22 DIAGNOSIS — U071 COVID-19: Secondary | ICD-10-CM | POA: Insufficient documentation

## 2022-09-22 DIAGNOSIS — R519 Headache, unspecified: Secondary | ICD-10-CM

## 2022-09-22 DIAGNOSIS — R000 Tachycardia, unspecified: Secondary | ICD-10-CM | POA: Insufficient documentation

## 2022-09-22 LAB — RESP PANEL BY RT-PCR (RSV, FLU A&B, COVID)  RVPGX2
Influenza A by PCR: NEGATIVE
Influenza B by PCR: NEGATIVE
Resp Syncytial Virus by PCR: NEGATIVE
SARS Coronavirus 2 by RT PCR: POSITIVE — AB

## 2022-09-22 LAB — GROUP A STREP BY PCR: Group A Strep by PCR: NOT DETECTED

## 2022-09-22 MED ORDER — KETOROLAC TROMETHAMINE 15 MG/ML IJ SOLN
15.0000 mg | Freq: Once | INTRAMUSCULAR | Status: AC
  Administered 2022-09-22: 15 mg via INTRAMUSCULAR
  Filled 2022-09-22: qty 1

## 2022-09-22 MED ORDER — ACETAMINOPHEN 325 MG PO TABS
650.0000 mg | ORAL_TABLET | Freq: Once | ORAL | Status: AC
  Administered 2022-09-22: 650 mg via ORAL
  Filled 2022-09-22: qty 2

## 2022-09-22 NOTE — ED Triage Notes (Signed)
Pt presents with body aches, headache, chills. Was seen here last night and dx with COVID. Pt states she doesn't feel better.

## 2022-09-22 NOTE — ED Provider Notes (Signed)
[place] EMERGENCY DEPARTMENT AT [facility] Provider Note   CSN: [id number] Arrival date & time: 09/22/22  1523     History  Chief Complaint  Patient presents with   Headache    Shelley Doyle is a 57 y.o. female with overall noncontributory past medical history presents concern for body aches, headache, chills.  Was seen here last night and diagnosed with COVID.  Patient reports that she does not feel significantly better.  She had Toradol, Tylenol last night.  She reports she has not taken anything at home.  She denies significant chest pain, shortness of breath.  She is wondering why we did not prescribe her Paxlovid after talking to a doctor that she works with.   Headache      Home Medications Prior to Admission medications   Medication Sig Start Date End Date Taking? Authorizing Provider  Biotin 1000 MCG tablet Take 1,000 mcg by mouth daily.    [provider]  ciprofloxacin (CIPRO) 500 MG tablet Take 1 tablet (500 mg total) by mouth 2 (two) times daily. 12/16/20   Zadie Rhine, MD  HYDROcodone-acetaminophen (NORCO/VICODIN) 5-325 MG tablet Take 1 tablet by mouth every 6 (six) hours as needed for severe pain. 12/16/20   Zadie Rhine, MD  MELATONIN PO Take 1 tablet by mouth daily as needed (sleep).    [provider]  metroNIDAZOLE (FLAGYL) 500 MG tablet Take 1 tablet (500 mg total) by mouth 2 (two) times daily. One po bid x 7 days 12/16/20   Zadie Rhine, MD      Allergies    Patient has no known allergies.    Review of Systems   Review of Systems  Neurological:  Positive for headaches.  All other systems reviewed and are negative.   Physical Exam Updated Vital Signs BP 114/72 (BP Location: Right Arm)   Pulse (!) 103   Temp 100.1 F (37.8 C) (Oral)   Resp 16   Wt 72.6 kg   LMP 06/22/2019   SpO2 97%   BMI 23.12 kg/m  Physical Exam Vitals and nursing note reviewed.  Constitutional:      General: She is not in acute  distress.    Appearance: Normal appearance.  HENT:     Head: Normocephalic and atraumatic.  Eyes:     General:        Right eye: No discharge.        Left eye: No discharge.  Cardiovascular:     Rate and Rhythm: Regular rhythm. Tachycardia present.  Pulmonary:     Effort: Pulmonary effort is normal. No respiratory distress.     Comments: No wheezing, rhonchi, stridor, rales Musculoskeletal:        General: No deformity.  Skin:    General: Skin is warm and dry.  Neurological:     Mental Status: She is alert and oriented to person, place, and time.  Psychiatric:        Mood and Affect: Mood normal.        Behavior: Behavior normal.     ED Results / Procedures / Treatments   Labs (all labs ordered are listed, but only abnormal results are displayed) Labs Reviewed - No data to display  EKG None  Radiology DG Chest 2 View  Result Date: 09/22/2022 CLINICAL DATA:  Cough and nausea, initial encounter EXAM: CHEST - 2 VIEW COMPARISON:  07/05/2019 FINDINGS: The heart size and mediastinal contours are within normal limits. Both lungs are clear. The visualized skeletal  structures are unremarkable. IMPRESSION: No active cardiopulmonary disease. Electronically Signed   By: Alcide Clever M.D.   On: 09/22/2022 00:18    Procedures Procedures    Medications Ordered in ED Medications  acetaminophen (TYLENOL) tablet 650 mg (has no administration in time range)  ketorolac (TORADOL) 15 MG/ML injection 15 mg (has no administration in time range)    ED Course/ Medical Decision Making/ A&P                                 Medical Decision Making  This is a moderately ill-appearing 57yo female who presents with concern for 3 days of cough, fever, sore throat, headache.  My emergent differential diagnosis includes acute upper respiratory infection with COVID, flu, RSV versus new asthma presentation, acute bronchitis, less clinical concern for pneumonia.  Notably with recent diagnosis of COVID  just last night.  Also considered other ENT emergencies, Ludwig angina, strep pharyngitis, mono, versus epiglottis, tonsillitis versus other.  This is not an exhaustive differential.  On my exam patient is overall well-appearing, they have temperature of 100.1, breathing unlabored, no tachypnea, no respiratory distress, stable oxygen saturation.  Patient with mild tachycardia, pulse 103 on arrival.   Patient symptoms are consistent with ongoing COVID.  Administered Toradol, Tylenol, and discharged stable condition.  Encouraged ibuprofen, Tylenol, rest, plenty of fluids.  Discussed extensive return precautions.  Patient discharged in stable condition at this time.  Discussed Paxlovid without significant improvement of symptoms for patients without significant comorbidities, do not think indicated at this time.  Patient understands and agrees to this plan.  Encouraged hydration, rest.  Final Clinical Impression(s) / ED Diagnoses Final diagnoses:  COVID-19  Acute nonintractable headache, unspecified headache type    Rx / DC Orders ED Discharge Orders     None         [place] 09/22/22 1637    Bethann Berkshire, MD 09/23/22 1248

## 2022-09-22 NOTE — Discharge Instructions (Addendum)
Please use Tylenol or ibuprofen for pain.  You may use 600 mg ibuprofen every 6 hours or 1000 mg of Tylenol every 6 hours.  You may choose to alternate between the 2.  This would be most effective.  Not to exceed 4 g of Tylenol within 24 hours.  Not to exceed 3200 mg ibuprofen 24 hours.  

## 2022-09-22 NOTE — ED Provider Notes (Signed)
[place] EMERGENCY DEPARTMENT AT [facility] Provider Note   CSN: [id number] Arrival date & time: 09/21/22  2153     History  Chief Complaint  Patient presents with   Headache   Nasal Congestion    Shelley Doyle is a 57 y.o. female.  [place] 1 day of mild headache fevers and chills and cough.  Had had a sore throat for couple days prior to this.  Has some sinus pressure as well.  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  No other sick contacts.  No nausea or vomiting.  No diarrhea or constipation.  No recent travels.   Headache      Home Medications Prior to Admission medications   Medication Sig Start Date End Date Taking? Authorizing Provider  Biotin 1000 MCG tablet Take 1,000 mcg by mouth daily.    [provider]  ciprofloxacin (CIPRO) 500 MG tablet Take 1 tablet (500 mg total) by mouth 2 (two) times daily. 12/16/20   Zadie Rhine, MD  HYDROcodone-acetaminophen (NORCO/VICODIN) 5-325 MG tablet Take 1 tablet by mouth every 6 (six) hours as needed for severe pain. 12/16/20   Zadie Rhine, MD  MELATONIN PO Take 1 tablet by mouth daily as needed (sleep).    [provider]  metroNIDAZOLE (FLAGYL) 500 MG tablet Take 1 tablet (500 mg total) by mouth 2 (two) times daily. One po bid x 7 days 12/16/20   Zadie Rhine, MD      Allergies    Patient has no known allergies.    Review of Systems   Review of Systems  Neurological:  Positive for headaches.    Physical Exam Updated Vital Signs BP 113/65   Pulse (!) 115   Temp 98.4 F (36.9 C) (Oral)   Resp 18   LMP 06/22/2019   SpO2 99%  Physical Exam Vitals and nursing note reviewed.  Constitutional:      Appearance: She is well-developed.  HENT:     Head: Normocephalic and atraumatic.  Cardiovascular:     Rate and Rhythm: Normal rate and regular rhythm.  Pulmonary:     Effort: No respiratory distress.     Breath sounds: No  stridor.  Abdominal:     General: There is no distension.  Musculoskeletal:     Cervical back: Normal range of motion.  Neurological:     Mental Status: She is alert.     ED Results / Procedures / Treatments   Labs (all labs ordered are listed, but only abnormal results are displayed) Labs Reviewed  RESP PANEL BY RT-PCR (RSV, FLU A&B, COVID)  RVPGX2 - Abnormal; Notable for the following components:      Result Value   SARS Coronavirus 2 by RT PCR POSITIVE (*)    All other components within normal limits  GROUP A STREP BY PCR    EKG None  Radiology DG Chest 2 View  Result Date: 09/22/2022 CLINICAL DATA:  Cough and nausea, initial encounter EXAM: CHEST - 2 VIEW COMPARISON:  07/05/2019 FINDINGS: The heart size and mediastinal contours are within normal limits. Both lungs are clear. The visualized skeletal structures are unremarkable. IMPRESSION: No active cardiopulmonary disease. Electronically Signed   By: Alcide Clever M.D.   On: 09/22/2022 00:18    Procedures Procedures    Medications Ordered in ED Medications  acetaminophen (TYLENOL) tablet 650 mg (650 mg Oral Given 09/21/22 2237)  ketorolac (TORADOL) 30 MG/ML injection 30  mg (30 mg Intramuscular Given 09/22/22 0004)    ED Course/ Medical Decision Making/ A&P                                 Medical Decision Making Amount and/or Complexity of Data Reviewed Radiology: ordered.  Risk Prescription drug management.   Patient found to have COVID.  X-ray negative for any pneumonia.  No indication for starting any kind of at home treatment.  Patient will utilize ibuprofen and Tylenol for symptomatic treatment at home.  Final Clinical Impression(s) / ED Diagnoses Final diagnoses:  COVID-19    Rx / DC Orders ED Discharge Orders     None         Jairy Angulo, Barbara Cower, MD 09/22/22 [phone]

## 2024-02-26 ENCOUNTER — Emergency Department (HOSPITAL_COMMUNITY)
Admission: EM | Admit: 2024-02-26 | Discharge: 2024-02-26 | Disposition: A | Discharge status: Home / Self Care | Payer: Self-pay | Attending: Emergency Medicine | Admitting: Emergency Medicine

## 2024-02-26 ENCOUNTER — Encounter (HOSPITAL_COMMUNITY): Payer: Self-pay

## 2024-02-26 ENCOUNTER — Other Ambulatory Visit: Payer: Self-pay

## 2024-02-26 DIAGNOSIS — H109 Unspecified conjunctivitis: Secondary | ICD-10-CM | POA: Insufficient documentation

## 2024-02-26 MED ORDER — TETRACAINE HCL 0.5 % OP SOLN
2.0000 [drp] | Freq: Once | OPHTHALMIC | Status: AC
  Administered 2024-02-26: 2 [drp] via OPHTHALMIC
  Filled 2024-02-26: qty 4

## 2024-02-26 MED ORDER — TOBRAMYCIN 0.3 % OP SOLN
2.0000 [drp] | OPHTHALMIC | Status: DC
  Administered 2024-02-26: 2 [drp] via OPHTHALMIC
  Filled 2024-02-26: qty 5

## 2024-02-26 MED ORDER — FLUORESCEIN SODIUM 1 MG OP STRP
1.0000 | ORAL_STRIP | Freq: Once | OPHTHALMIC | Status: AC
  Administered 2024-02-26: 1 via OPHTHALMIC
  Filled 2024-02-26: qty 1

## 2024-02-26 NOTE — ED Triage Notes (Signed)
 Pt reports irrigating her eye without relief and presents today for concerns of a possible infection.

## 2024-02-26 NOTE — ED Provider Notes (Signed)
 " [place] EMERGENCY DEPARTMENT AT [facility] Provider Note   CSN: [id number] Arrival date & time: 02/26/24  1505     Patient presents with: Eye Problem (Left eye)   Shelley Doyle is a 59 y.o. female.    Eye Problem Associated symptoms: itching and photophobia        Shelley Doyle is a 59 y.o. female who presents to the Emergency Department complaining of itching and excessive tearing of the left eye.  Symptoms present x 2 days.  Endorses photophobia.  She has foreign body sensation to the eye.  States that her vision is somewhat blurred to her left eye as well.  She wears glasses, denies contact use or known injury.  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  Denies any headache, dizziness, facial pain, redness or swelling of her face or periorbital area.  Prior to Admission medications  Medication Sig Start Date End Date Taking? Authorizing Provider  Biotin 1000 MCG tablet Take 1,000 mcg by mouth daily.    [provider]  ciprofloxacin  (CIPRO ) 500 MG tablet Take 1 tablet (500 mg total) by mouth 2 (two) times daily. 12/16/20   Midge Golas, MD  HYDROcodone -acetaminophen  (NORCO/VICODIN) 5-325 MG tablet Take 1 tablet by mouth every 6 (six) hours as needed for severe pain. 12/16/20   Midge Golas, MD  MELATONIN PO Take 1 tablet by mouth daily as needed (sleep).    [provider]  metroNIDAZOLE  (FLAGYL ) 500 MG tablet Take 1 tablet (500 mg total) by mouth 2 (two) times daily. One po bid x 7 days 12/16/20   Midge Golas, MD    Allergies: Patient has no known allergies.    Review of Systems  Eyes:  Positive for photophobia, itching and visual disturbance.  All other systems reviewed and are negative.   Updated Vital Signs BP (!) 171/104   Pulse (!) 107   Temp 97.8 F (36.6 C) (Temporal)    Resp 18   Ht 5' 9.75 (1.772 m)   Wt 72.6 kg   LMP 06/22/2019   SpO2 99%   BMI 23.13 kg/m   Physical Exam Vitals and nursing note reviewed.  Constitutional:      General: She is not in acute distress.    Appearance: Normal appearance. She is not ill-appearing or toxic-appearing.  HENT:     Nose: Nose normal.     Mouth/Throat:     Mouth: Mucous membranes are moist.  Eyes:     General: Lids are everted, no foreign bodies appreciated. Vision grossly intact. Gaze aligned appropriately.        Right eye: No foreign body, discharge or hordeolum.        Left eye: No foreign body, discharge or hordeolum.     Extraocular Movements: Extraocular movements intact.     Conjunctiva/sclera:     Right eye: Right conjunctiva is not injected. No chemosis.    Left eye: Left conjunctiva is injected. No chemosis.    Pupils: Pupils are equal, round, and reactive to light.     Left eye: No corneal abrasion or fluorescein  uptake. Seidel exam negative.    Funduscopic exam:       Left eye: No hemorrhage or papilledema.     Slit lamp exam:    Left eye: Photophobia present. No corneal ulcer, foreign body or hyphema.  Comments: Sclera mildly injected.  No foreign body seen.  No edema of the lids, or crusting.  Negative for fluorescein  uptake.  Cardiovascular:     Rate and Rhythm: Normal rate and regular rhythm.     Pulses: Normal pulses.  Pulmonary:     Effort: Pulmonary effort is normal.  Musculoskeletal:        General: Normal range of motion.     Cervical back: Normal range of motion. No rigidity.  Skin:    General: Skin is warm.     Capillary Refill: Capillary refill takes less than 2 seconds.  Neurological:     General: No focal deficit present.     Mental Status: She is alert.     Sensory: No sensory deficit.     Motor: No weakness.     (all labs ordered are listed, but only abnormal results are displayed) Labs Reviewed - No data to display  EKG: None  Radiology: No results  found.   Procedures   Medications Ordered in the ED  tetracaine  (PONTOCAINE) 0.5 % ophthalmic solution 2 drop (has no administration in time range)  fluorescein  ophthalmic strip 1 strip (has no administration in time range)                                    Medical Decision Making   Patient here for evaluation of possible infection to her left eye.  She endorses itching and excessive tearing.  Some blurred vision.  No headache dizziness, nausea vomiting or abnormal consensual reflex test.  Without history of trauma, or headache or dizziness.  Doubt neurologic process.  Suspect conjunctivitis likely viral.   There is no preauricular lymphadenopathy or dental symptoms.  No facial edema or erythema.    Amount and/or Complexity of Data Reviewed Discussion of management or test interpretation with external provider(s):   Visual Acuity Bilateral Distance: 20/25 R Distance: 20/30 L Distance: 20/30  Likely viral conjunctivitis.  She will take over-the-counter Benadryl  if needed for itching cool compresses to her eye.  Tobramycin  dispensed here.  She has a ophthalmologist will follow-up in the next 1 to 2 days for recheck.  She was given return precautions as well.  Appears appropriate for discharge home at this time     Risk Prescription drug management.        Final diagnoses:  Conjunctivitis of left eye, unspecified conjunctivitis type    ED Discharge Orders     None          Herlinda Milling, PA-C 03/01/24 [id number]    Dean Clarity, MD 03/01/24 1739  "

## 2024-02-26 NOTE — Discharge Instructions (Signed)
 Apply 1 to 2 drops of the tobramycin  to your left eye 4 times a day for 5 to 7 days.  Cool compresses to your eye.  Avoid rubbing your eye.  Please call your ophthalmologist or the ophthalmologist listed to arrange follow-up appointment.  Return to emergency department for any new or worsening symptoms.

## 2024-02-26 NOTE — ED Triage Notes (Signed)
 Pt arrived via POV c/o left eye problems that began on Sunday. Pt reports it has been irritated, itching and feels like something is stuck in it and endorses photosensitivity in that eye.
